# Patient Record
Sex: Female | Born: 1990 | Race: Black or African American | Hispanic: No | Marital: Married | Smoking: Never smoker
Health system: Southern US, Community
[De-identification: ages and names within clinical notes are randomized; demographics above are authoritative.]

## PROBLEM LIST (undated history)

## (undated) ENCOUNTER — Inpatient Hospital Stay (HOSPITAL_COMMUNITY): Payer: Self-pay

## (undated) DIAGNOSIS — B999 Unspecified infectious disease: Secondary | ICD-10-CM

## (undated) DIAGNOSIS — G43909 Migraine, unspecified, not intractable, without status migrainosus: Secondary | ICD-10-CM

---

## 2014-04-14 ENCOUNTER — Ambulatory Visit (INDEPENDENT_AMBULATORY_CARE_PROVIDER_SITE_OTHER): Payer: BLUE CROSS/BLUE SHIELD | Admitting: Family Medicine

## 2014-04-14 VITALS — BP 120/72 | HR 76 | Temp 97.5°F | Resp 16 | Ht 63.0 in | Wt 170.8 lb

## 2014-04-14 DIAGNOSIS — H6120 Impacted cerumen, unspecified ear: Secondary | ICD-10-CM

## 2014-04-14 NOTE — Patient Instructions (Signed)
Good to see you today- take care  Let me know if your left shoulder is not continuing to improve Do some "wall crawls" with your fingers to maintain your shoulder range of motion  I am sorry that you had this fall- please let me know if you don't continue getting your strength back Assuming all is well please see me in about 4-6 months  

## 2014-04-14 NOTE — Progress Notes (Signed)
[facility] [address] [place] [address] [phone]  Date:  04/14/2014   Name:  Connie Medina   DOB:  1990-03-21   MRN:  [id number]  PCP:  No primary care provider on file.    Chief Complaint: Cerumen Impaction   History of Present Illness:  Connie Medina is a 24 y.o. very pleasant female patient who presents with the following:  She notes a problem with her right ear- she tried to clean it out yesterday with a home cerumen impaction kit but his seems to have make it worse.  It does not hurt but it is hard for her to hear.   OW she feels fell today  There are no active problems to display for this patient.   History reviewed. No pertinent past medical history.  History reviewed. No pertinent past surgical history.  History  Substance Use Topics  . Smoking status: Never Smoker   . Smokeless tobacco: Not on file  . Alcohol Use: Not on file    No family history on file.  No Known Allergies  Medication list has been reviewed and updated.  No current outpatient prescriptions on file prior to visit.   No current facility-administered medications on file prior to visit.    Review of Systems:  As per HPI- otherwise negative.   Physical Examination: Filed Vitals:   04/14/14 1307  BP: 120/72  Pulse: 76  Temp: 97.5 F (36.4 C)  Resp: 16   Filed Vitals:   04/14/14 1307  Height: 5' 3"  (1.6 m)  Weight: 170 lb 12.8 oz (77.474 kg)   Body mass index is 30.26 kg/(m^2). Ideal Body Weight: Weight in (lb) to have BMI = 25: 140.8  GEN: WDWN, NAD, Non-toxic, A & O x 3, looks well, overweight HEENT: Atraumatic, Normocephalic. Neck supple. No masses, No LAD.  PEERL,  Bilateral ears with cerumen impaction Ears and Nose: No external deformity. CV: RRR, No M/G/R. No JVD. No thrill. No extra heart sounds. PULM: CTA B, no wheezes, crackles, rhonchi. No retractions. No resp. distress. No accessory muscle use. EXTR: No c/c/e NEURO Normal gait.   PSYCH: Normally interactive. Conversant. Not depressed or anxious appearing.  Calm demeanor.   Bilateral cerumen impaction, irrigated and easily cleared with warm water.  Ear canals clear, bilateral TM wnl, sx resolved   Assessment and Plan: Cerumen impaction, unspecified laterality  Resolved as above.  No other concerns today   Signed Lamar Blinks, MD

## 2014-07-21 ENCOUNTER — Ambulatory Visit (INDEPENDENT_AMBULATORY_CARE_PROVIDER_SITE_OTHER): Payer: BLUE CROSS/BLUE SHIELD | Admitting: Family Medicine

## 2014-07-21 VITALS — BP 110/80 | HR 66 | Temp 98.3°F | Resp 18 | Ht 62.5 in | Wt 168.8 lb

## 2014-07-21 DIAGNOSIS — Z0000 Encounter for general adult medical examination without abnormal findings: Secondary | ICD-10-CM | POA: Diagnosis not present

## 2014-07-21 DIAGNOSIS — Z0184 Encounter for antibody response examination: Secondary | ICD-10-CM

## 2014-07-21 DIAGNOSIS — Z111 Encounter for screening for respiratory tuberculosis: Secondary | ICD-10-CM | POA: Diagnosis not present

## 2014-07-21 DIAGNOSIS — Z789 Other specified health status: Secondary | ICD-10-CM

## 2014-07-21 NOTE — Progress Notes (Signed)
Subjective:  This chart was scribed for Connie Cheadle, MD by Moises Blood, Medical Scribe. This patient was seen in Room 3 and the patient's care was started 5:29 PM.    Patient ID: Connie Medina, female    DOB: 1990/04/19, 24 y.o.   MRN: [id number] Chief Complaint  Patient presents with  . Annual Exam    for school physical    HPI Connie Medina is a 24 y.o. female who presents to [facility] for annual exam for school.  Her last Tdap was last done in 2008. She has her MMR done. She needs surface antibody quant titer done.  She denies being on any medication. She also denies any injuries recently.  No Fhx of cancer.   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   History reviewed. No pertinent past medical history. History reviewed. No pertinent past surgical history. No current outpatient prescriptions on file prior to visit.   No current facility-administered medications on file prior to visit.   No Known Allergies History reviewed. No pertinent family history. History   Social History  . Marital Status: Single    Spouse Name: N/A  . Number of Children: N/A  . Years of Education: N/A   Social History Main Topics  . Smoking status: Never Smoker   . Smokeless tobacco: Not on file  . Alcohol Use: Not on file  . Drug Use: Not on file  . Sexual Activity: Not on file   Other Topics Concern  . None   Social History Narrative     Review of Systems  Constitutional: Negative for fever, chills, appetite change and fatigue.  HENT: Negative for congestion, rhinorrhea and sore throat.   Respiratory: Negative for cough and shortness of breath.   Gastrointestinal: Negative for nausea, vomiting and diarrhea.  Skin: Negative for rash and wound.  All other systems reviewed and are negative.      Objective:   Physical Exam  Constitutional: She is oriented to person, place, and time. She appears well-developed and well-nourished. No distress.  HENT:  Head: Normocephalic and  atraumatic.  Right Ear: External ear normal.  Left Ear: External ear normal.  Nose: Nose normal.  Mouth/Throat: No oropharyngeal exudate.  Eyes: EOM are normal. Pupils are equal, round, and reactive to light.  Neck: Neck supple. No thyromegaly present.  Cardiovascular: Normal rate, regular rhythm, S1 normal, S2 normal and normal heart sounds.   No murmur heard. Pulses:      Dorsalis pedis pulses are 2+ on the right side, and 2+ on the left side.  Pulmonary/Chest: Effort normal and breath sounds normal. No respiratory distress. She has no wheezes.  Abdominal: Soft. Bowel sounds are normal.  Musculoskeletal: Normal range of motion. She exhibits no edema.  Lymphadenopathy:    She has no cervical adenopathy.  Neurological: She is alert and oriented to person, place, and time.  Reflex Scores:      Patellar reflexes are 2+ on the right side and 2+ on the left side.      Achilles reflexes are 2+ on the right side and 2+ on the left side. Skin: Skin is warm and dry.  Psychiatric: She has a normal mood and affect. Her behavior is normal.  Nursing note and vitals reviewed.   BP 110/80 mmHg  Pulse 66  Temp(Src) 98.3 F (36.8 C) (Oral)  Resp 18  Ht 5' 2.5" (1.588 m)  Wt 168 lb 12.8 oz (76.567 kg)  BMI 30.36 kg/m2  SpO2 99%  LMP  07/12/2014       Assessment & Plan:  HPV series before 24 years of age, recommended RTC for pap smear even if not sexually active recommended Tetanus, due Feb 2018, recommended screening labs lipid, cmp, etc once every 5 years, pt declines for today, but will maybe in future convenience.  1. Health maintenance examination - all prior immunizations were present on [facility] and copy given to pt - she had tdap 03/2006 so needs td booster next yr - pt declines today. Has had varicella x2 and menactra x1.  Has not had HPV - pt recommended to start series prior to 24 yo and prior to beginning sexual activity.  Has never had pap, periods nml, no prior intercourse and no  plans to change that in the near future  2. 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   Form for school completed today.  Orders Placed This Encounter  Procedures  . Hepatitis B surface antibody  . TB Skin Test    Order Specific Question:  Has patient ever tested positive?    Answer:  No    No orders of the defined types were placed in this encounter.    I personally performed the services described in this documentation, which was scribed in my presence. The recorded information has been reviewed and considered, and addended by me as needed.  Connie Cheadle, MD MPH

## 2014-07-21 NOTE — Patient Instructions (Addendum)
Recommend HPV series.  Needs Td by 03/2016  Health Maintenance Adopting a healthy lifestyle and getting preventive care can go a long way to promote health and wellness. Talk with your health care provider about what schedule of regular examinations is right for you. This is a good chance for you to check in with your provider about disease prevention and staying healthy. In between checkups, there are plenty of things you can do on your own. Experts have done a lot of research about which lifestyle changes and preventive measures are most likely to keep you healthy. Ask your health care provider for more information. WEIGHT AND DIET  Eat a healthy diet  Be sure to include plenty of vegetables, fruits, low-fat dairy products, and lean protein.  Do not eat a lot of foods high in solid fats, added sugars, or salt.  Get regular exercise. This is one of the most important things you can do for your health.  Most adults should exercise for at least 150 minutes each week. The exercise should increase your heart rate and make you sweat (moderate-intensity exercise).  Most adults should also do strengthening exercises at least twice a week. This is in addition to the moderate-intensity exercise.  Maintain a healthy weight  Body mass index (BMI) is a measurement that can be used to identify possible weight problems. It estimates body fat based on height and weight. Your health care provider can help determine your BMI and help you achieve or maintain a healthy weight.  For females 39 years of age and older:   A BMI below 18.5 is considered underweight.  A BMI of 18.5 to 24.9 is normal.  A BMI of 25 to 29.9 is considered overweight.  A BMI of 30 and above is considered obese.  Watch levels of cholesterol and blood lipids  You should start having your blood tested for lipids and cholesterol at 24 years of age, then have this test every 5 years.  You may need to have your cholesterol levels  checked more often if:  Your lipid or cholesterol levels are high.  You are older than 24 years of age.  You are at high risk for heart disease.  CANCER SCREENING   Lung Cancer  Lung cancer screening is recommended for adults 1-43 years old who are at high risk for lung cancer because of a history of smoking.  A yearly low-dose CT scan of the lungs is recommended for people who:  Currently smoke.  Have quit within the past 15 years.  Have at least a 30-pack-year history of smoking. A pack year is smoking an average of one pack of cigarettes a day for 1 year.  Yearly screening should continue until it has been 15 years since you quit.  Yearly screening should stop if you develop a health problem that would prevent you from having lung cancer treatment.  Breast Cancer  Practice breast self-awareness. This means understanding how your breasts normally appear and feel.  It also means doing regular breast self-exams. Let your health care provider know about any changes, no matter how small.  If you are in your 20s or 30s, you should have a clinical breast exam (CBE) by a health care provider every 1-3 years as part of a regular health exam.  If you are 25 or older, have a CBE every year. Also consider having a breast X-ray (mammogram) every year.  If you have a family history of breast cancer, talk to your health  care provider about genetic screening.  If you are at high risk for breast cancer, talk to your health care provider about having an MRI and a mammogram every year.  Breast cancer gene (BRCA) assessment is recommended for women who have family members with BRCA-related cancers. BRCA-related cancers include:  Breast.  Ovarian.  Tubal.  Peritoneal cancers.  Results of the assessment will determine the need for genetic counseling and BRCA1 and BRCA2 testing. Cervical Cancer Routine pelvic examinations to screen for cervical cancer are no longer recommended for  nonpregnant women who are considered low risk for cancer of the pelvic organs (ovaries, uterus, and vagina) and who do not have symptoms. A pelvic examination may be necessary if you have symptoms including those associated with pelvic infections. Ask your health care provider if a screening pelvic exam is right for you.   The Pap test is the screening test for cervical cancer for women who are considered at risk.  If you had a hysterectomy for a problem that was not cancer or a condition that could lead to cancer, then you no longer need Pap tests.  If you are older than 65 years, and you have had normal Pap tests for the past 10 years, you no longer need to have Pap tests.  If you have had past treatment for cervical cancer or a condition that could lead to cancer, you need Pap tests and screening for cancer for at least 20 years after your treatment.  If you no longer get a Pap test, assess your risk factors if they change (such as having a new sexual partner). This can affect whether you should start being screened again.  Some women have medical problems that increase their chance of getting cervical cancer. If this is the case for you, your health care provider may recommend more frequent screening and Pap tests.  The human papillomavirus (HPV) test is another test that may be used for cervical cancer screening. The HPV test looks for the virus that can cause cell changes in the cervix. The cells collected during the Pap test can be tested for HPV.  The HPV test can be used to screen women 43 years of age and older. Getting tested for HPV can extend the interval between normal Pap tests from three to five years.  An HPV test also should be used to screen women of any age who have unclear Pap test results.  After 24 years of age, women should have HPV testing as often as Pap tests.  Colorectal Cancer  This type of cancer can be detected and often prevented.  Routine colorectal cancer  screening usually begins at 24 years of age and continues through 24 years of age.  Your health care provider may recommend screening at an earlier age if you have risk factors for colon cancer.  Your health care provider may also recommend using home test kits to check for hidden blood in the stool.  A small camera at the end of a tube can be used to examine your colon directly (sigmoidoscopy or colonoscopy). This is done to check for the earliest forms of colorectal cancer.  Routine screening usually begins at age 31.  Direct examination of the colon should be repeated every 5-10 years through 24 years of age. However, you may need to be screened more often if early forms of precancerous polyps or small growths are found. Skin Cancer  Check your skin from head to toe regularly.  Tell your health  care provider about any new moles or changes in moles, especially if there is a change in a mole's shape or color.  Also tell your health care provider if you have a mole that is larger than the size of a pencil eraser.  Always use sunscreen. Apply sunscreen liberally and repeatedly throughout the day.  Protect yourself by wearing long sleeves, pants, a wide-brimmed hat, and sunglasses whenever you are outside. HEART DISEASE, DIABETES, AND HIGH BLOOD PRESSURE   Have your blood pressure checked at least every 1-2 years. High blood pressure causes heart disease and increases the risk of stroke.  If you are between 55 years and 87 years old, ask your health care provider if you should take aspirin to prevent strokes.  Have regular diabetes screenings. This involves taking a blood sample to check your fasting blood sugar level.  If you are at a normal weight and have a low risk for diabetes, have this test once every three years after 24 years of age.  If you are overweight and have a high risk for diabetes, consider being tested at a younger age or more often. PREVENTING INFECTION  Hepatitis  B  If you have a higher risk for hepatitis B, you should be screened for this virus. You are considered at high risk for hepatitis B if:  You were born in a country where hepatitis B is common. Ask your health care provider which countries are considered high risk.  Your parents were born in a high-risk country, and you have not been immunized against hepatitis B (hepatitis B vaccine).  You have HIV or AIDS.  You use needles to inject street drugs.  You live with someone who has hepatitis B.  You have had sex with someone who has hepatitis B.  You get hemodialysis treatment.  You take certain medicines for conditions, including cancer, organ transplantation, and autoimmune conditions. Hepatitis C  Blood testing is recommended for:  Everyone born from [birth year] through 1965.  Anyone with known risk factors for hepatitis C. Sexually transmitted infections (STIs)  You should be screened for sexually transmitted infections (STIs) including gonorrhea and chlamydia if:  You are sexually active and are younger than 24 years of age.  You are older than 24 years of age and your health care provider tells you that you are at risk for this type of infection.  Your sexual activity has changed since you were last screened and you are at an increased risk for chlamydia or gonorrhea. Ask your health care provider if you are at risk.  If you do not have HIV, but are at risk, it may be recommended that you take a prescription medicine daily to prevent HIV infection. This is called pre-exposure prophylaxis (PrEP). You are considered at risk if:  You are sexually active and do not regularly use condoms or know the HIV status of your partner(s).  You take drugs by injection.  You are sexually active with a partner who has HIV. Talk with your health care provider about whether you are at high risk of being infected with HIV. If you choose to begin PrEP, you should first be tested for HIV. You should  then be tested every 3 months for as long as you are taking PrEP.  PREGNANCY   If you are premenopausal and you may become pregnant, ask your health care provider about preconception counseling.  If you may become pregnant, take 400 to 800 micrograms (mcg) of folic acid every day.  If you want to prevent pregnancy, talk to your health care provider about birth control (contraception). OSTEOPOROSIS AND MENOPAUSE   Osteoporosis is a disease in which the bones lose minerals and strength with aging. This can result in serious bone fractures. Your risk for osteoporosis can be identified using a bone density scan.  If you are 92 years of age or older, or if you are at risk for osteoporosis and fractures, ask your health care provider if you should be screened.  Ask your health care provider whether you should take a calcium or vitamin D supplement to lower your risk for osteoporosis.  Menopause may have certain physical symptoms and risks.  Hormone replacement therapy may reduce some of these symptoms and risks. Talk to your health care provider about whether hormone replacement therapy is right for you.  HOME CARE INSTRUCTIONS   Schedule regular health, dental, and eye exams.  Stay current with your immunizations.   Do not use any tobacco products including cigarettes, chewing tobacco, or electronic cigarettes.  If you are pregnant, do not drink alcohol.  If you are breastfeeding, limit how much and how often you drink alcohol.  Limit alcohol intake to no more than 1 drink per day for nonpregnant women. One drink equals 12 ounces of beer, 5 ounces of wine, or 1 ounces of hard liquor.  Do not use street drugs.  Do not share needles.  Ask your health care provider for help if you need support or information about quitting drugs.  Tell your health care provider if you often feel depressed.  Tell your health care provider if you have ever been abused or do not feel safe at  home. Document Released: 08/02/2010 Document Revised: 06/03/2013 Document Reviewed: 12/19/2012 [facility] Patient Information 2015 [place], [place]. This information is not intended to replace advice given to you by your health care provider. Make sure you discuss any questions you have with your health care provider.

## 2014-07-22 LAB — HEPATITIS B SURFACE ANTIBODY, QUANTITATIVE: Hepatitis B-Post: 506 m[IU]/mL

## 2014-07-23 ENCOUNTER — Encounter: Payer: Self-pay | Admitting: Family Medicine

## 2014-07-24 ENCOUNTER — Ambulatory Visit (INDEPENDENT_AMBULATORY_CARE_PROVIDER_SITE_OTHER): Payer: BLUE CROSS/BLUE SHIELD

## 2014-07-24 DIAGNOSIS — Z111 Encounter for screening for respiratory tuberculosis: Secondary | ICD-10-CM

## 2014-07-24 LAB — TB SKIN TEST: TB Skin Test: NEGATIVE

## 2014-07-24 NOTE — Progress Notes (Deleted)
   Subjective:    Patient ID: Connie Medina, female    DOB: [birth year], 24 y.o.   MRN: [id number]  HPI    Review of Systems     Objective:   Physical Exam        Assessment & Plan:

## 2014-07-24 NOTE — Progress Notes (Signed)
Patient came in today for PPD read. Was negative with 0 mm induration.

## 2014-08-05 ENCOUNTER — Encounter (HOSPITAL_COMMUNITY): Payer: Self-pay | Admitting: Emergency Medicine

## 2014-08-05 ENCOUNTER — Emergency Department (HOSPITAL_COMMUNITY)
Admission: EM | Admit: 2014-08-05 | Discharge: 2014-08-05 | Disposition: A | Discharge status: Home / Self Care | Payer: BLUE CROSS/BLUE SHIELD | Source: Home / Self Care | Attending: Emergency Medicine | Admitting: Emergency Medicine

## 2014-08-05 DIAGNOSIS — A059 Bacterial foodborne intoxication, unspecified: Secondary | ICD-10-CM | POA: Diagnosis not present

## 2014-08-05 LAB — POCT URINALYSIS DIP (DEVICE)
Bilirubin Urine: NEGATIVE
Glucose, UA: NEGATIVE mg/dL
Hgb urine dipstick: NEGATIVE
Ketones, ur: NEGATIVE mg/dL
LEUKOCYTES UA: NEGATIVE
NITRITE: NEGATIVE
PROTEIN: NEGATIVE mg/dL
Specific Gravity, Urine: >=1.03 (ref 1.005–1.030)
Urobilinogen, UA: 0.2 mg/dL (ref 0.0–1.0)
pH: 5.5 (ref 5.0–8.0)

## 2014-08-05 LAB — POCT PREGNANCY, URINE: Preg Test, Ur: NEGATIVE

## 2014-08-05 MED ORDER — ONDANSETRON HCL 4 MG PO TABS: 4.0000 mg | ORAL_TABLET | Freq: Three times a day (TID) | ORAL | Status: DC | PRN

## 2014-08-05 NOTE — ED Provider Notes (Signed)
CSN: [id number]     Arrival date & time 08/05/14  1426 History   First MD Initiated Contact with Patient 08/05/14 1614     Chief Complaint  Patient presents with  . Abdominal Pain   (Consider location/radiation/quality/duration/timing/severity/associated sxs/prior Treatment) HPI  She is a 24 year old woman here for evaluation of abdominal pain and diarrhea. Her symptoms started this morning with watery diarrhea. Over the course of the day she developed intermittent abdominal pains as well as some nausea. She denies any vomiting or fevers. She is currently fasting for Ramadan so has not tried eating or drinking anything. No vomiting. She denies any dizziness or palpitations.  She ate at a friend's house last night. She denies any food that had been out a long time and does not remember any undercooked meat or eggs.  History reviewed. No pertinent past medical history. History reviewed. No pertinent past surgical history. No family history on file. History  Substance Use Topics  . Smoking status: Never Smoker   . Smokeless tobacco: Not on file  . Alcohol Use: Not on file   OB History    No data available     Review of Systems As in history of present illness Allergies  Review of patient's allergies indicates no known allergies.  Home Medications   Prior to Admission medications   Medication Sig Start Date End Date Taking? Authorizing Provider  ondansetron (ZOFRAN) 4 MG tablet Take 1 tablet (4 mg total) by mouth every 8 (eight) hours as needed for nausea or vomiting. 08/05/14   Charm RingsErin J Breyona Swander, MD   BP 111/82 mmHg  Pulse 71  Temp(Src) 99.2 F (37.3 C) (Oral)  Resp 16  SpO2 100%  LMP 07/12/2014 Physical Exam  Constitutional: She is oriented to person, place, and time. She appears well-developed and well-nourished. No distress.  Cardiovascular: Normal rate, regular rhythm and normal heart sounds.   Pulmonary/Chest: Effort normal and breath sounds normal. No respiratory distress. She  has no wheezes. She has no rales.  Abdominal: Soft. She exhibits no distension and no mass. There is no tenderness. There is no rebound and no guarding.  Increased bowel sounds  Neurological: She is alert and oriented to person, place, and time.  Skin: Skin is warm and dry.    ED Course  Procedures (including critical care time) Labs Review Labs Reviewed  POCT URINALYSIS DIP (DEVICE)  POCT PREGNANCY, URINE    Imaging Review No results found.   MDM   1. Food poisoning    Zofran for nausea. Discussed importance of hydration. Return precautions reviewed. Work note provided.    Charm RingsErin J Kohle Winner, MD 08/05/14 [phone]

## 2014-08-05 NOTE — ED Notes (Signed)
C/o abd pain onset this am when she woke up Sx also include: fatigue, diarrhea, nauseas Denies urinary sx, fevers, chills Alert, no signs of acute distress.

## 2014-08-05 NOTE — Discharge Instructions (Signed)
This is likely food poisoning. Take Zofran every 8 hours as needed for nausea. Make sure you are taking plenty of fluids when able. The diarrhea should resolve in the next 1-2 days. If you are getting dizzy or your heart rate is racing, please come back.

## 2014-09-12 ENCOUNTER — Other Ambulatory Visit: Payer: Self-pay | Admitting: Otolaryngology

## 2014-09-25 ENCOUNTER — Encounter (HOSPITAL_BASED_OUTPATIENT_CLINIC_OR_DEPARTMENT_OTHER): Payer: Self-pay | Admitting: *Deleted

## 2014-09-30 ENCOUNTER — Ambulatory Visit (HOSPITAL_BASED_OUTPATIENT_CLINIC_OR_DEPARTMENT_OTHER): Payer: BLUE CROSS/BLUE SHIELD | Admitting: Certified Registered"

## 2014-09-30 ENCOUNTER — Encounter (HOSPITAL_BASED_OUTPATIENT_CLINIC_OR_DEPARTMENT_OTHER)
Admission: RE | Disposition: A | Discharge status: Home / Self Care | Payer: Self-pay | Source: Ambulatory Visit | Attending: Otolaryngology

## 2014-09-30 ENCOUNTER — Ambulatory Visit (HOSPITAL_BASED_OUTPATIENT_CLINIC_OR_DEPARTMENT_OTHER)
Admission: RE | Admit: 2014-09-30 | Discharge: 2014-09-30 | Disposition: A | Discharge status: Home / Self Care | Payer: BLUE CROSS/BLUE SHIELD | Source: Ambulatory Visit | Attending: Otolaryngology | Admitting: Otolaryngology

## 2014-09-30 ENCOUNTER — Encounter (HOSPITAL_BASED_OUTPATIENT_CLINIC_OR_DEPARTMENT_OTHER): Payer: Self-pay

## 2014-09-30 DIAGNOSIS — J3501 Chronic tonsillitis: Secondary | ICD-10-CM | POA: Diagnosis not present

## 2014-09-30 DIAGNOSIS — J353 Hypertrophy of tonsils with hypertrophy of adenoids: Secondary | ICD-10-CM | POA: Diagnosis not present

## 2014-09-30 HISTORY — PX: TONSILLECTOMY AND ADENOIDECTOMY: SHX28

## 2014-09-30 LAB — POCT HEMOGLOBIN-HEMACUE: Hemoglobin: 13.3 g/dL (ref 12.0–15.0)

## 2014-09-30 SURGERY — TONSILLECTOMY AND ADENOIDECTOMY
Anesthesia: General | Laterality: Bilateral

## 2014-09-30 MED ORDER — PROPOFOL 10 MG/ML IV BOLUS
INTRAVENOUS | Status: DC | PRN
  Administered 2014-09-30: 250 mg via INTRAVENOUS

## 2014-09-30 MED ORDER — OXYCODONE HCL 5 MG/5ML PO SOLN: 5.0000 mg | Freq: Once | ORAL | Status: DC | PRN

## 2014-09-30 MED ORDER — HYDROMORPHONE HCL 1 MG/ML IJ SOLN
INTRAMUSCULAR | Status: AC
  Filled 2014-09-30: qty 1

## 2014-09-30 MED ORDER — BACITRACIN 500 UNIT/GM EX OINT
TOPICAL_OINTMENT | CUTANEOUS | Status: DC | PRN
  Administered 2014-09-30: 1 via TOPICAL

## 2014-09-30 MED ORDER — SODIUM CHLORIDE 0.9 % IR SOLN
Status: DC | PRN
  Administered 2014-09-30: 1

## 2014-09-30 MED ORDER — BACITRACIN ZINC 500 UNIT/GM EX OINT
TOPICAL_OINTMENT | CUTANEOUS | Status: AC
  Filled 2014-09-30: qty 1.8

## 2014-09-30 MED ORDER — SCOPOLAMINE 1 MG/3DAYS TD PT72: 1.0000 | MEDICATED_PATCH | Freq: Once | TRANSDERMAL | Status: DC | PRN

## 2014-09-30 MED ORDER — DEXAMETHASONE SODIUM PHOSPHATE 4 MG/ML IJ SOLN
INTRAMUSCULAR | Status: DC | PRN
  Administered 2014-09-30: 10 mg via INTRAVENOUS

## 2014-09-30 MED ORDER — BACITRACIN ZINC 500 UNIT/GM EX OINT
TOPICAL_OINTMENT | CUTANEOUS | Status: AC
  Filled 2014-09-30: qty 28.35

## 2014-09-30 MED ORDER — LACTATED RINGERS IV SOLN
INTRAVENOUS | Status: DC
  Administered 2014-09-30 (×2): via INTRAVENOUS

## 2014-09-30 MED ORDER — MIDAZOLAM HCL 2 MG/2ML IJ SOLN
INTRAMUSCULAR | Status: AC
  Filled 2014-09-30: qty 2

## 2014-09-30 MED ORDER — LIDOCAINE HCL (CARDIAC) 20 MG/ML IV SOLN
INTRAVENOUS | Status: DC | PRN
  Administered 2014-09-30: 80 mg via INTRAVENOUS

## 2014-09-30 MED ORDER — MEPERIDINE HCL 25 MG/ML IJ SOLN: 6.2500 mg | INTRAMUSCULAR | Status: DC | PRN

## 2014-09-30 MED ORDER — MUPIROCIN 2 % EX OINT
TOPICAL_OINTMENT | CUTANEOUS | Status: AC
  Filled 2014-09-30: qty 22

## 2014-09-30 MED ORDER — HYDROMORPHONE HCL 1 MG/ML IJ SOLN
0.2500 mg | INTRAMUSCULAR | Status: DC | PRN
  Administered 2014-09-30 (×4): 0.5 mg via INTRAVENOUS

## 2014-09-30 MED ORDER — PROPOFOL 500 MG/50ML IV EMUL
INTRAVENOUS | Status: AC
  Filled 2014-09-30: qty 50

## 2014-09-30 MED ORDER — MIDAZOLAM HCL 5 MG/5ML IJ SOLN
INTRAMUSCULAR | Status: DC | PRN
  Administered 2014-09-30: 2 mg via INTRAVENOUS

## 2014-09-30 MED ORDER — SUCCINYLCHOLINE CHLORIDE 20 MG/ML IJ SOLN
INTRAMUSCULAR | Status: DC | PRN
  Administered 2014-09-30: 100 mg via INTRAVENOUS

## 2014-09-30 MED ORDER — OXYMETAZOLINE HCL 0.05 % NA SOLN
NASAL | Status: AC
  Filled 2014-09-30: qty 30

## 2014-09-30 MED ORDER — ONDANSETRON HCL 4 MG/2ML IJ SOLN
INTRAMUSCULAR | Status: DC | PRN
  Administered 2014-09-30: 4 mg via INTRAVENOUS

## 2014-09-30 MED ORDER — OXYCODONE HCL 5 MG PO TABS: 5.0000 mg | ORAL_TABLET | Freq: Once | ORAL | Status: DC | PRN

## 2014-09-30 MED ORDER — OXYMETAZOLINE HCL 0.05 % NA SOLN
NASAL | Status: DC | PRN
  Administered 2014-09-30: 1 via TOPICAL

## 2014-09-30 MED ORDER — AMOXICILLIN 400 MG/5ML PO SUSR: 800.0000 mg | Freq: Two times a day (BID) | ORAL | Status: AC

## 2014-09-30 MED ORDER — OXYCODONE HCL 5 MG/5ML PO SOLN: 5.0000 mg | ORAL | Status: DC | PRN

## 2014-09-30 MED ORDER — LIDOCAINE-EPINEPHRINE 1 %-1:100000 IJ SOLN
INTRAMUSCULAR | Status: AC
  Filled 2014-09-30: qty 2

## 2014-09-30 MED ORDER — FENTANYL CITRATE (PF) 100 MCG/2ML IJ SOLN
INTRAMUSCULAR | Status: AC
  Filled 2014-09-30: qty 4

## 2014-09-30 MED ORDER — FENTANYL CITRATE (PF) 100 MCG/2ML IJ SOLN
INTRAMUSCULAR | Status: DC | PRN
  Administered 2014-09-30 (×2): 50 ug via INTRAVENOUS
  Administered 2014-09-30: 100 ug via INTRAVENOUS

## 2014-09-30 MED ORDER — GLYCOPYRROLATE 0.2 MG/ML IJ SOLN: 0.2000 mg | Freq: Once | INTRAMUSCULAR | Status: DC | PRN

## 2014-09-30 SURGICAL SUPPLY — 34 items
BANDAGE COBAN STERILE 2 (GAUZE/BANDAGES/DRESSINGS) IMPLANT
CANISTER SUCT 1200ML W/VALVE (MISCELLANEOUS) ×3 IMPLANT
CATH ROBINSON RED A/P 10FR (CATHETERS) IMPLANT
CATH ROBINSON RED A/P 14FR (CATHETERS) ×3 IMPLANT
COAGULATOR SUCT 6 FR SWTCH (ELECTROSURGICAL)
COAGULATOR SUCT SWTCH 10FR 6 (ELECTROSURGICAL) IMPLANT
COVER MAYO STAND STRL (DRAPES) ×3 IMPLANT
ELECT REM PT RETURN 9FT ADLT (ELECTROSURGICAL) ×3
ELECT REM PT RETURN 9FT PED (ELECTROSURGICAL)
ELECTRODE REM PT RETRN 9FT PED (ELECTROSURGICAL) IMPLANT
ELECTRODE REM PT RTRN 9FT ADLT (ELECTROSURGICAL) ×1 IMPLANT
GLOVE BIO SURGEON STRL SZ 6.5 (GLOVE) ×2 IMPLANT
GLOVE BIO SURGEON STRL SZ7.5 (GLOVE) ×3 IMPLANT
GLOVE BIO SURGEONS STRL SZ 6.5 (GLOVE) ×1
GLOVE BIOGEL PI IND STRL 7.0 (GLOVE) ×1 IMPLANT
GLOVE BIOGEL PI INDICATOR 7.0 (GLOVE) ×2
GOWN STRL REUS W/ TWL LRG LVL3 (GOWN DISPOSABLE) ×2 IMPLANT
GOWN STRL REUS W/TWL LRG LVL3 (GOWN DISPOSABLE) ×4
IV NS 500ML (IV SOLUTION) ×2
IV NS 500ML BAXH (IV SOLUTION) ×1 IMPLANT
MARKER SKIN DUAL TIP RULER LAB (MISCELLANEOUS) IMPLANT
NS IRRIG 1000ML POUR BTL (IV SOLUTION) ×3 IMPLANT
SHEET MEDIUM DRAPE 40X70 STRL (DRAPES) ×3 IMPLANT
SOLUTION BUTLER CLEAR DIP (MISCELLANEOUS) ×3 IMPLANT
SPONGE GAUZE 4X4 12PLY STER LF (GAUZE/BANDAGES/DRESSINGS) ×3 IMPLANT
SPONGE TONSIL 1 RF SGL (DISPOSABLE) IMPLANT
SPONGE TONSIL 1.25 RF SGL STRG (GAUZE/BANDAGES/DRESSINGS) ×3 IMPLANT
SYR BULB 3OZ (MISCELLANEOUS) ×3 IMPLANT
TOWEL OR 17X24 6PK STRL BLUE (TOWEL DISPOSABLE) ×3 IMPLANT
TUBE CONNECTING 20'X1/4 (TUBING) ×1
TUBE CONNECTING 20X1/4 (TUBING) ×2 IMPLANT
TUBE SALEM SUMP 12R W/ARV (TUBING) IMPLANT
TUBE SALEM SUMP 16 FR W/ARV (TUBING) ×3 IMPLANT
WAND COBLATOR 70 EVAC XTRA (SURGICAL WAND) ×3 IMPLANT

## 2014-09-30 NOTE — Discharge Instructions (Addendum)
°Post Anesthesia Home Care Instructions ° °Activity: °Get plenty of rest for the remainder of the day. A responsible adult should stay with you for 24 hours following the procedure.  °For the next 24 hours, DO NOT: °-Drive a car °-Operate machinery °-Drink alcoholic beverages °-Take any medication unless instructed by your physician °-Make any legal decisions or sign important papers. ° °Meals: °Start with liquid foods such as gelatin or soup. Progress to regular foods as tolerated. Avoid greasy, spicy, heavy foods. If nausea and/or vomiting occur, drink only clear liquids until the nausea and/or vomiting subsides. Call your physician if vomiting continues. ° °Special Instructions/Symptoms: °Your throat may feel dry or sore from the anesthesia or the breathing tube placed in your throat during surgery. If this causes discomfort, gargle with warm salt water. The discomfort should disappear within 24 hours. ° °If you had a scopolamine patch placed behind your ear for the management of post- operative nausea and/or vomiting: ° °1. The medication in the patch is effective for 72 hours, after which it should be removed.  Wrap patch in a tissue and discard in the trash. Wash hands thoroughly with soap and water. °2. You may remove the patch earlier than 72 hours if you experience unpleasant side effects which may include dry mouth, dizziness or visual disturbances. °3. Avoid touching the patch. Wash your hands with soap and water after contact with the patch. °  °[facility] TE[facility] M[facility] °Postoperative Instructions for Tonsillectomy & Adenoidectomy (T&A) °Activity °Restrict activity at home for the first two days, resting as much as possible. Light indoor activity is best. You may usually return to school or work within a week but void strenuous activity and sports for two weeks. Sleep with your head elevated on 2-3 pillows for 3-4 days to help decrease swelling. °Diet °Due to tissue swelling and throat discomfort, you  may have little desire to drink for several days. However fluids are very important to prevent dehydration. You will find that non-acidic juices, soups, popsicles, Jell-O, custard, puddings, and any soft or mashed foods taken in small quantities can be swallowed fairly easily. Try to increase your fluid and food intake as the discomfort subsides. It is recommended that a child receive 1-1/2 quarts of fluid in a 24-hour period. Adult require twice this amount.  °Discomfort °Your sore throat may be relieved by applying an ice collar to your neck and/or by taking Tylenol®. You may experience an earache, which is due to referred pain from the throat. Referred ear pain is commonly felt at night when trying to rest. ° °Bleeding                        Although rare, there is risk of having some bleeding during the first 2 weeks after having a T&A. This usually happens between days 7-10 postoperatively. If you or your child should have any bleeding, try to remain calm. We recommend sitting up quietly in a chair and gently spitting out the blood into a bowl. For adults, gargling gently with ice water may help. If the bleeding does not stop after a short time (5 minutes), is more than 1 teaspoonful, or if you become worried, please call our office at [phone] or go directly to the nearest hospital emergency room. Do not eat or drink anything prior to going to the hospital as you may need to be taken to the operating room in order to control the bleeding. °GENERAL CONSIDERATIONS °  1. Brush your teeth regularly. Avoid mouthwashes and gargles for three weeks. You may gargle gently with warm salt-water as necessary or spray with Chloraseptic®. You may make salt-water by placing 2 teaspoons of table salt into a quart of fresh water. Warm the salt-water in a microwave to a luke warm temperature.  °2. Avoid exposure to colds and upper respiratory infections if possible.  °3. If you look into a mirror or into your child's mouth,  you will see white-gray patches in the back of the throat. This is normal after having a T&A and is like a scab that forms on the skin after an abrasion. It will disappear once the back of the throat heals completely. However, it may cause a noticeable odor; this too will disappear with time. Again, warm salt-water gargles may be used to help keep the throat clean and promote healing.  °4. You may notice a temporary change in voice quality, such as a higher pitched voice or a nasal sound, until healing is complete. This may last for 1-2 weeks and should resolve.  °5. Do not take or give you child any medications that we have not prescribed or recommended.  °6. Snoring may occur, especially at night, for the first week after a T&A. It is due to swelling of the soft palate and will usually resolve.  °Please call our office at [phone] if you have any questions.   °

## 2014-09-30 NOTE — Anesthesia Postprocedure Evaluation (Signed)
  Anesthesia Post-op Note  Patient: [name]  Procedure(s) Performed: Procedure(s): BILATERAL TONSILLECTOMY AND ADENOIDECTOMY (Bilateral)  Patient Location: PACU  Anesthesia Type: General   Level of Consciousness: awake, alert  and oriented  Airway and Oxygen Therapy: Patient Spontanous Breathing  Post-op Pain: mild  Post-op Assessment: Post-op Vital signs reviewed  Post-op Vital Signs: Reviewed  Last Vitals:  Filed Vitals:   09/30/14 0915  BP: 121/78  Pulse: 63  Temp: 36.9 C  Resp: 20    Complications: No apparent anesthesia complications

## 2014-09-30 NOTE — Anesthesia Preprocedure Evaluation (Signed)

## 2014-09-30 NOTE — H&P (Signed)
Cc: Adenotonsillar hypertrophy, chronic tonsillitis  HPI: The patient is a 24 y/o female who presents today with complaints of frequent tonsilloliths and halitosis. The patient has noted the problems for most of her life but they have gradually becoming more bothersome. She notes frequent sore throats. She has no significant history of snoring. The patient is otherwise healthy. Previous ENT surgery is denied.  The patient's review of systems (constitutional, eyes, ENT, cardiovascular, respiratory, GI, musculoskeletal, skin, neurologic, psychiatric, endocrine, hematologic, allergic) is noted in the ROS questionnaire.  It is reviewed with the patient.   Allergies: None  Family health history: Diabetes.   Major events: None.   Ongoing medical problems: Migraines.   Social history: The patient is single. She denies the use of alcohol, tobacco or illegal drugs.  Exam General: Communicates without difficulty, well nourished, no acute distress. Head: Normocephalic, no evidence injury, no tenderness, facial buttresses intact without stepoff. Eyes: PERRL, EOMI. No scleral icterus, conjunctivae clear. Neuro: CN II exam reveals vision grossly intact.  No nystagmus at any point of gaze. Ears: Auricles well formed without lesions.  Ear canals are intact without mass or lesion.  No erythema or edema is appreciated.  The TMs are intact without fluid. Nose: External evaluation reveals normal support and skin without lesions.  Dorsum is intact.  Anterior rhinoscopy reveals healthy pink mucosa over anterior aspect of inferior turbinates and intact septum.  No purulence noted. Oral:  Oral cavity and oropharynx are intact, symmetric, without erythema or edema.  Mucosa is moist without lesions. Tonsils 2+,cryptic. Neck: Full range of motion without pain.  There is no significant lymphadenopathy.  No masses palpable.  Thyroid bed within normal limits to palpation.  Parotid glands and submandibular glands equal  bilaterally without mass.  Trachea is midline. Neuro:  CN 2-12 grossly intact. Gait normal. Vestibular: No nystagmus at any point of gaze. The cerebellar examination is unremarkable.   Assessment Chronic tonsillitis with history of frequent tonsilloliths with associated halitosis. Tonsils are 2+ and cryptic.   Plan  1. The treatment options include continuing conservative observation versus adenotonsillectomy.  Based on the patient's history and physical exam findings, the patient will likely benefit from having the tonsils and possibly the adenoid removed.  The risks, benefits, alternatives, and details of the procedure are reviewed with the patient.  Questions are invited and answered.  2. The patient is interested in proceeding with the procedure.  We will schedule the procedure in accordance with the patient's schedule.

## 2014-09-30 NOTE — Transfer of Care (Signed)
Immediate Anesthesia Transfer of Care Note  Patient: Connie Medina  Procedure(s) Performed: Procedure(s): BILATERAL TONSILLECTOMY AND ADENOIDECTOMY (Bilateral)  Patient Location: PACU  Anesthesia Type:General  Level of Consciousness: awake, alert  and patient cooperative  Airway & Oxygen Therapy: Patient Spontanous Breathing and Patient connected to face mask oxygen  Post-op Assessment: Report given to RN, Post -op Vital signs reviewed and stable and Patient moving all extremities  Post vital signs: Reviewed and stable  Last Vitals:  Filed Vitals:   09/30/14 0633  BP: 118/80  Pulse: 67  Temp: 36.9 C  Resp: 18    Complications: No apparent anesthesia complications

## 2014-09-30 NOTE — Op Note (Signed)
DATE OF PROCEDURE:  09/30/2014                              OPERATIVE REPORT  SURGEON:  Newman Pies, MD  PREOPERATIVE DIAGNOSES: 1. Adenotonsillar hypertrophy. 2. Chronic tonsillitis and pharyngitis  POSTOPERATIVE DIAGNOSES: 1. Adenotonsillar hypertrophy. 2. Chronic tonsillitis and pharyngitis  PROCEDURE PERFORMED:  Adenotonsillectomy.  ANESTHESIA:  General endotracheal tube anesthesia.  COMPLICATIONS:  None.  ESTIMATED BLOOD LOSS:  Minimal.  INDICATION FOR PROCEDURE:  Connie Medina is a 24 y.o. female with a history of chronic tonsillitis/pharyngitis and halitosis.  According to the patient, she has been experiencing chronic throat discomfort with halitosis for several years. The patient continues to be symptomatic despite medical treatments. On examination, the patient was noted to have bilateral cryptic tonsils, with numerous tonsilloliths. Based on the above findings, the decision was made for the patient to undergo the adenotonsillectomy procedure. Likelihood of success in reducing symptoms was also discussed.  The risks, benefits, alternatives, and details of the procedure were discussed with the mother.  Questions were invited and answered.  Informed consent was obtained.  DESCRIPTION:  The patient was taken to the operating room and placed supine on the operating table.  General endotracheal tube anesthesia was administered by the anesthesiologist.  The patient was positioned and prepped and draped in a standard fashion for adenotonsillectomy.  A Crowe-Davis mouth gag was inserted into the oral cavity for exposure. 2+ cryptic tonsils were noted bilaterally.  No bifidity was noted.  Indirect mirror examination of the nasopharynx revealed mild adenoid hypertrophy. The adenoid was resected with the Coblator device. Hemostasis was achieved with the Coblator device.  The right tonsil was then grasped with a straight Allis clamp and retracted medially.  It was resected free from the underlying  pharyngeal constrictor muscles with the Coblator device.  The same procedure was repeated on the left side without exception.  The surgical sites were copiously irrigated.  The mouth gag was removed.  The care of the patient was turned over to the anesthesiologist.  The patient was awakened from anesthesia without difficulty.  The patient was extubated and transferred to the recovery room in good condition.  OPERATIVE FINDINGS:  Adenotonsillar hypertrophy.  SPECIMEN:  Bilateral tonsils  FOLLOWUP CARE:  The patient will be discharged home once awake and alert.  She will be placed on amoxicillin 800 mg p.o. b.i.d. for 5 days, and Roxicet 5-40ml po q 4 hours for postop pain control.   The patient will follow up in my office in approximately 2 weeks.  Sherrice Creekmore,SUI W 09/30/2014 8:16 AM

## 2014-09-30 NOTE — Anesthesia Procedure Notes (Signed)
Procedure Name: Intubation Date/Time: 09/30/2014 7:42 AM Performed by: Curly [place] Pre-anesthesia Checklist: Patient identified, Emergency Drugs available, Suction available and Patient being monitored Patient Re-evaluated:Patient Re-evaluated prior to inductionOxygen Delivery Method: Circle System Utilized Preoxygenation: Pre-oxygenation with 100% oxygen Intubation Type: IV induction Ventilation: Mask ventilation without difficulty Laryngoscope Size: Miller and 2 Grade View: Grade I Tube type: Oral Tube size: 7.0 mm Number of attempts: 1 Airway Equipment and Method: Stylet Placement Confirmation: ETT inserted through vocal cords under direct vision,  positive ETCO2 and breath sounds checked- equal and bilateral Secured at: 21 cm Tube secured with: Tape Dental Injury: Teeth and Oropharynx as per pre-operative assessment

## 2014-10-01 ENCOUNTER — Encounter (HOSPITAL_BASED_OUTPATIENT_CLINIC_OR_DEPARTMENT_OTHER): Payer: Self-pay | Admitting: Otolaryngology

## 2015-03-23 ENCOUNTER — Ambulatory Visit (INDEPENDENT_AMBULATORY_CARE_PROVIDER_SITE_OTHER): Payer: BLUE CROSS/BLUE SHIELD | Admitting: Physician Assistant

## 2015-03-23 VITALS — BP 116/84 | HR 68 | Temp 98.1°F | Resp 16 | Ht 63.25 in | Wt 172.0 lb

## 2015-03-23 DIAGNOSIS — G43009 Migraine without aura, not intractable, without status migrainosus: Secondary | ICD-10-CM

## 2015-03-23 MED ORDER — BUTALBITAL-APAP-CAFFEINE 50-325-40 MG PO TABS: 1.0000 | ORAL_TABLET | Freq: Four times a day (QID) | ORAL | Status: AC | PRN

## 2015-03-23 NOTE — Progress Notes (Signed)
03/23/2015 3:22 PM   DOB: Jan 12, 1991 / MRN: [id number]  SUBJECTIVE:  Connie Medina is a 25 y.o. female presenting for recurring HA that is often severe and only occurs behind her right eye.  States that she has these roughly once every two months, states that she has photophobia and is typically debilitated by these HA's.  Denies any weakness and focal neurological problems associated with these HA. HA's can last up to two days.   Had her eyes checked and this did not help.  Has not seen a neurologist.  HA's have been occuring for roughly 5 years. She would like to try Fiorcet, of which her friend gave her one for a similar HA and this worked well for her.    She has No Known Allergies.   She  has no past medical history on file.    She  reports that she has never smoked. She does not have any smokeless tobacco history on file. She  has no sexual activity history on file. The patient  has past surgical history that includes Tonsillectomy and adenoidectomy (Bilateral, 09/30/2014).  Her family history is not on file.  Review of Systems  Constitutional: Negative for fever and chills.  Eyes: Negative for blurred vision.  Respiratory: Negative for cough and shortness of breath.   Cardiovascular: Negative for chest pain.  Gastrointestinal: Negative for nausea and abdominal pain.  Genitourinary: Negative for dysuria, urgency and frequency.  Musculoskeletal: Negative for myalgias.  Skin: Negative for rash.  Neurological: Negative for dizziness, tingling and headaches.  Psychiatric/Behavioral: Negative for depression. The patient is not nervous/anxious.     Problem list and medications reviewed and updated by myself where necessary, and exist elsewhere in the encounter.   OBJECTIVE:  BP 116/84 mmHg  Pulse 68  Temp(Src) 98.1 F (36.7 C)  Resp 16  Ht 5' 3.25" (1.607 m)  Wt 172 lb (78.019 kg)  BMI 30.21 kg/m2  SpO2 96%  Physical Exam  Constitutional: She is oriented to person, place,  and time. She appears well-nourished. No distress.  Eyes: EOM are normal. Pupils are equal, round, and reactive to light.  Cardiovascular: Normal rate.   Pulmonary/Chest: Effort normal.  Abdominal: She exhibits no distension.  Neurological: She is alert and oriented to person, place, and time. She has normal strength and normal reflexes. She displays no atrophy and no tremor. No cranial nerve deficit or sensory deficit. She exhibits normal muscle tone. She displays a negative Romberg sign. She displays no seizure activity. Coordination and gait normal. GCS eye subscore is 4. GCS verbal subscore is 5. GCS motor subscore is 6.  ROM, FTN, position sense, temp sensation, and soft touch to the extremities intact to challenge.    Skin: Skin is dry. She is not diaphoretic.  Psychiatric: She has a normal mood and affect.  Vitals reviewed.   No results found for this or any previous visit (from the past 72 hour(s)).  No results found.  ASSESSMENT AND PLAN  Connie Medina was seen today for migraine.  Diagnoses and all orders for this visit:  Migraine without aura and without status migrainosus, not intractable: The her HA's are consistent with migraine aside from the duration of her HA.  Will send her Neuro for a more through evaluation in the coming weeks.  Fiorcet for now.   -     Ambulatory referral to Neurology -     butalbital-acetaminophen-caffeine (FIORICET) 50-325-40 MG tablet; Take 1-2 tablets by mouth every 6 (six) hours as  needed for headache.    The patient was advised to call or return to clinic if she does not see an improvement in symptoms or to seek the care of the closest emergency department if she worsens with the above plan.   Deliah Boston, MHS, PA-C [facility] 03/23/2015 3:22 PM

## 2015-09-01 ENCOUNTER — Encounter (HOSPITAL_COMMUNITY): Payer: Self-pay | Admitting: Emergency Medicine

## 2015-09-01 ENCOUNTER — Ambulatory Visit (HOSPITAL_COMMUNITY)
Admission: EM | Admit: 2015-09-01 | Discharge: 2015-09-01 | Disposition: A | Discharge status: Home / Self Care | Payer: BLUE CROSS/BLUE SHIELD | Attending: Family Medicine | Admitting: Family Medicine

## 2015-09-01 DIAGNOSIS — R21 Rash and other nonspecific skin eruption: Secondary | ICD-10-CM | POA: Diagnosis not present

## 2015-09-01 HISTORY — DX: Migraine, unspecified, not intractable, without status migrainosus: G43.909

## 2015-09-01 MED ORDER — METHYLPREDNISOLONE ACETATE 80 MG/ML IJ SUSP
80.0000 mg | Freq: Once | INTRAMUSCULAR | Status: AC
  Administered 2015-09-01: 80 mg via INTRAMUSCULAR

## 2015-09-01 MED ORDER — METHYLPREDNISOLONE ACETATE 80 MG/ML IJ SUSP
INTRAMUSCULAR | Status: AC
  Filled 2015-09-01: qty 1

## 2015-09-01 MED ORDER — HYDROXYZINE HCL 25 MG PO TABS: 25.0000 mg | ORAL_TABLET | Freq: Four times a day (QID) | ORAL | 0 refills | Status: DC

## 2015-09-01 MED ORDER — METHYLPREDNISOLONE 4 MG PO TBPK: ORAL_TABLET | ORAL | 0 refills | Status: DC

## 2015-09-01 NOTE — ED Provider Notes (Signed)
CSN: [id number]     Arrival date & time 09/01/15  1517 History   None    Chief Complaint  Patient presents with  . Rash   (Consider location/radiation/quality/duration/timing/severity/associated sxs/prior Treatment) Patient got tattoos on bilateral wrists and hands and now she has rash and blisters and itching.   The history is provided by the patient.  Rash  Location:  Hand Hand rash location:  R hand, L hand, L wrist and R wrist Quality: blistering and itchiness   Onset quality:  Sudden Duration:  1 day Timing:  Constant Progression:  Spreading Chronicity:  New Context: chemical exposure   Relieved by:  Antihistamines and antibiotic cream Worsened by:  Nothing Ineffective treatments:  Antibiotic cream and antihistamines   Past Medical History:  Diagnosis Date  . Migraines    Past Surgical History:  Procedure Laterality Date  . TONSILLECTOMY AND ADENOIDECTOMY Bilateral 09/30/2014   Procedure: BILATERAL TONSILLECTOMY AND ADENOIDECTOMY;  Surgeon: Newman Pies, MD;  Location: [facility];  Service: ENT;  Laterality: Bilateral;   History reviewed. No pertinent family history. Social History  Substance Use Topics  . Smoking status: Never Smoker  . Smokeless tobacco: Never Used  . Alcohol use No   OB History    No data available     Review of Systems  Constitutional: Negative.   HENT: Negative.   Eyes: Negative.   Respiratory: Negative.   Cardiovascular: Negative.   Gastrointestinal: Negative.   Endocrine: Negative.   Genitourinary: Negative.   Skin: Positive for rash.  Allergic/Immunologic: Negative.   Neurological: Negative.   Hematological: Negative.   Psychiatric/Behavioral: Negative.     Allergies  Review of patient's allergies indicates no known allergies.  Home Medications   Prior to Admission medications   Medication Sig Start Date End Date Taking? Authorizing Provider  butalbital-acetaminophen-caffeine (FIORICET) [phone] MG tablet  Take 1-2 tablets by mouth every 6 (six) hours as needed for headache. 03/23/15 03/22/16 Yes Ofilia Neas, PA-C  hydrOXYzine (ATARAX/VISTARIL) 25 MG tablet Take 1 tablet (25 mg total) by mouth every 6 (six) hours. 09/01/15   Deatra Canter, FNP  methylPREDNISolone (MEDROL DOSEPAK) 4 MG TBPK tablet Take 6-5-4-3-2-1 po qd 09/01/15   Deatra Canter, FNP   Meds Ordered and Administered this Visit   Medications  methylPREDNISolone acetate (DEPO-MEDROL) injection 80 mg (80 mg Intramuscular Given 09/01/15 1715)    BP 115/67 (BP Location: Left Arm)   Pulse 86   Temp 98.7 F (37.1 C) (Oral)   Resp 16   LMP 08/15/2015 (Exact Date)   SpO2 100%  No data found.   Physical Exam  Constitutional: She appears well-developed and well-nourished.  HENT:  Head: Normocephalic and atraumatic.  Eyes: Conjunctivae and EOM are normal. Pupils are equal, round, and reactive to light.  Neck: Normal range of motion. Neck supple.  Cardiovascular: Normal rate, regular rhythm and normal heart sounds.   Pulmonary/Chest: Effort normal and breath sounds normal.  Skin: Rash noted.  Bilateral hands and wrists with henna tattoos and blisters and itchy rash on tattoos.  Nursing note and vitals reviewed.   Urgent Care Course   Clinical Course    Procedures (including critical care time)  Labs Review Labs Reviewed - No data to display  Imaging Review No results found.   Visual Acuity Review  Right Eye Distance:   Left Eye Distance:   Bilateral Distance:    Right Eye Near:   Left Eye Near:    Bilateral Near:  MDM   1. Rash    Depomedrol  IM Medrol dose pack as directe Hydroxyzine 25 mg one po tid prn #12      Deatra Canter, FNP 09/01/15 1729

## 2015-09-01 NOTE — ED Triage Notes (Signed)
Pt is having a reaction to the Henna tattoos she got on Saturday.  She has burning and blisters on her tattoos on her hands and forearms, but not her feet.

## 2016-08-07 ENCOUNTER — Encounter (HOSPITAL_COMMUNITY): Payer: Self-pay | Admitting: Emergency Medicine

## 2016-08-07 DIAGNOSIS — X58XXXA Exposure to other specified factors, initial encounter: Secondary | ICD-10-CM | POA: Insufficient documentation

## 2016-08-07 DIAGNOSIS — R21 Rash and other nonspecific skin eruption: Secondary | ICD-10-CM | POA: Diagnosis not present

## 2016-08-07 DIAGNOSIS — Y998 Other external cause status: Secondary | ICD-10-CM | POA: Diagnosis not present

## 2016-08-07 DIAGNOSIS — L2489 Irritant contact dermatitis due to other agents: Secondary | ICD-10-CM | POA: Diagnosis not present

## 2016-08-07 DIAGNOSIS — Y929 Unspecified place or not applicable: Secondary | ICD-10-CM | POA: Diagnosis not present

## 2016-08-07 DIAGNOSIS — S40822A Blister (nonthermal) of left upper arm, initial encounter: Secondary | ICD-10-CM | POA: Diagnosis present

## 2016-08-07 DIAGNOSIS — Y9389 Activity, other specified: Secondary | ICD-10-CM | POA: Diagnosis not present

## 2016-08-07 DIAGNOSIS — Z79899 Other long term (current) drug therapy: Secondary | ICD-10-CM | POA: Insufficient documentation

## 2016-08-07 NOTE — ED Triage Notes (Signed)
Pt reports getting henna tattoo applied to bilateral arms and hands on Thursday, started out as burning sensation and now blisters have formed all over hands and arms.

## 2016-08-07 NOTE — ED Notes (Signed)
Pt at nurses station requesting update, apologized for delay.

## 2016-08-08 ENCOUNTER — Emergency Department (HOSPITAL_COMMUNITY)
Admission: EM | Admit: 2016-08-08 | Discharge: 2016-08-08 | Disposition: A | Discharge status: Home / Self Care | Payer: BLUE CROSS/BLUE SHIELD | Attending: Emergency Medicine | Admitting: Emergency Medicine

## 2016-08-08 DIAGNOSIS — L309 Dermatitis, unspecified: Secondary | ICD-10-CM

## 2016-08-08 DIAGNOSIS — R21 Rash and other nonspecific skin eruption: Secondary | ICD-10-CM

## 2016-08-08 MED ORDER — METHYLPREDNISOLONE SODIUM SUCC 125 MG IJ SOLR
125.0000 mg | Freq: Once | INTRAMUSCULAR | Status: AC
  Administered 2016-08-08: 125 mg via INTRAMUSCULAR
  Filled 2016-08-08: qty 2

## 2016-08-08 MED ORDER — PREDNISONE 10 MG (21) PO TBPK: ORAL_TABLET | Freq: Every day | ORAL | 0 refills | Status: DC

## 2016-08-08 MED ORDER — DIPHENHYDRAMINE HCL 25 MG PO TABS: 25.0000 mg | ORAL_TABLET | Freq: Four times a day (QID) | ORAL | 0 refills | Status: DC

## 2016-08-08 MED ORDER — IBUPROFEN 800 MG PO TABS
800.0000 mg | ORAL_TABLET | Freq: Once | ORAL | Status: AC
  Administered 2016-08-08: 800 mg via ORAL
  Filled 2016-08-08: qty 1

## 2016-08-08 MED ORDER — IBUPROFEN 800 MG PO TABS: 800.0000 mg | ORAL_TABLET | Freq: Three times a day (TID) | ORAL | 0 refills | Status: DC

## 2016-08-08 MED ORDER — DIPHENHYDRAMINE HCL 25 MG PO CAPS
50.0000 mg | ORAL_CAPSULE | Freq: Once | ORAL | Status: AC
  Administered 2016-08-08: 50 mg via ORAL
  Filled 2016-08-08: qty 2

## 2016-08-08 NOTE — ED Provider Notes (Signed)
[facility]-EMERGENCY DEPT Provider Note   CSN: [id number] Arrival date & time: 08/07/16  2200     History   Chief Complaint Chief Complaint  Patient presents with  . Blisters    HPI Connie Medina is a 26 y.o. female who is previously healthy who presents with blisters on her arms and legs after getting a henna tattoo 4 days ago. Patient reports she initially had some burning and itching to the areas of the tattoo the first and second day, however then she developed blisters. She has had some of the blisters burst. She continues to have pain to the area. Patient has been applying steroid cream and taking antihistamine without significant relief. She also took one dose of Tylenol for pain. Patient reports she had small blisters develop one year ago when she got this similar tattoo, however not nearly as large blisters area patient was seen at urgent care for that at the time and states she got an injection in her glute and was sent home with steroids. She denies any fevers, chest pain, shortness of breath, swelling of her lips, tongue, throat, abdominal pain, nausea, vomiting, urinary symptoms.  HPI  Past Medical History:  Diagnosis Date  . Migraines     There are no active problems to display for this patient.   Past Surgical History:  Procedure Laterality Date  . TONSILLECTOMY AND ADENOIDECTOMY Bilateral 09/30/2014   Procedure: BILATERAL TONSILLECTOMY AND ADENOIDECTOMY;  Surgeon: Newman Pies, MD;  Location: [facility];  Service: ENT;  Laterality: Bilateral;    OB History    No data available       Home Medications    Prior to Admission medications   Medication Sig Start Date End Date Taking? Authorizing Provider  diphenhydrAMINE (BENADRYL) 25 MG tablet Take 1 tablet (25 mg total) by mouth every 6 (six) hours. 08/08/16   Emi Holes, PA-C  hydrOXYzine (ATARAX/VISTARIL) 25 MG tablet Take 1 tablet (25 mg total) by mouth every 6 (six) hours. 09/01/15   Deatra Canter,  FNP  ibuprofen (ADVIL,MOTRIN) 800 MG tablet Take 1 tablet (800 mg total) by mouth 3 (three) times daily. 08/08/16   Emi Holes, PA-C  methylPREDNISolone (MEDROL DOSEPAK) 4 MG TBPK tablet Take 6-5-4-3-2-1 po qd 09/01/15   Deatra Canter, FNP  predniSONE (STERAPRED UNI-PAK 21 TAB) 10 MG (21) TBPK tablet Take by mouth daily. Take 6 tabs by mouth daily  for 2 days, then 5 tabs for 2 days, then 4 tabs for 2 days, then 3 tabs for 2 days, 2 tabs for 2 days, then 1 tab by mouth daily for 2 days 08/08/16   Emi Holes, PA-C    Family History No family history on file.  Social History Social History  Substance Use Topics  . Smoking status: Never Smoker  . Smokeless tobacco: Never Used  . Alcohol use No     Allergies   Patient has no known allergies.   Review of Systems Review of Systems  Constitutional: Negative for chills and fever.  HENT: Negative for facial swelling and sore throat.   Respiratory: Negative for shortness of breath.   Cardiovascular: Negative for chest pain.  Gastrointestinal: Negative for abdominal pain, nausea and vomiting.  Genitourinary: Negative for dysuria.  Musculoskeletal: Negative for back pain.  Skin: Positive for rash. Negative for wound.  Neurological: Negative for headaches.  Psychiatric/Behavioral: The patient is not nervous/anxious.      Physical Exam Updated Vital Signs BP 109/72 (BP Location:  Right Arm)   Pulse 65   Temp (!) 97.5 F (36.4 C) (Oral)   Resp 18   Ht 5\' 2"  (1.575 m)   Wt 77.1 kg (170 lb)   LMP 07/08/2016 (Approximate)   SpO2 98%   BMI 31.09 kg/m   Physical Exam  Constitutional: She appears well-developed and well-nourished. No distress.  HENT:  Head: Normocephalic and atraumatic.  Mouth/Throat: Oropharynx is clear and moist. No oropharyngeal exudate.  Eyes: Conjunctivae are normal. Pupils are equal, round, and reactive to light. Right eye exhibits no discharge. Left eye exhibits no discharge. No scleral icterus.    Neck: Normal range of motion. Neck supple. No thyromegaly present.  Cardiovascular: Normal rate, regular rhythm, normal heart sounds and intact distal pulses.  Exam reveals no gallop and no friction rub.   No murmur heard. Pulmonary/Chest: Effort normal and breath sounds normal. No stridor. No respiratory distress. She has no wheezes. She has no rales.  Abdominal: Soft. Bowel sounds are normal. She exhibits no distension. There is no tenderness. There is no rebound and no guarding.  Musculoskeletal: She exhibits no edema.  Lymphadenopathy:    She has no cervical adenopathy.  Neurological: She is alert. Coordination normal.  Skin: Skin is warm and dry. No rash noted. She is not diaphoretic. No pallor.  Fluid-filled blisters over areas of henna tattoo on bilateral arms and legs; blisters worse on bilateral arms; unaffected areas nontender; no surrounding erythema  Psychiatric: She has a normal mood and affect.  Nursing note and vitals reviewed.            ED Treatments / Results  Labs (all labs ordered are listed, but only abnormal results are displayed) Labs Reviewed - No data to display  EKG  EKG Interpretation None       Radiology No results found.  Procedures Procedures (including critical care time)  Medications Ordered in ED Medications  methylPREDNISolone sodium succinate (SOLU-MEDROL) 125 mg/2 mL injection 125 mg (125 mg Intramuscular Given 08/08/16 0138)  diphenhydrAMINE (BENADRYL) capsule 50 mg (50 mg Oral Given 08/08/16 0138)  ibuprofen (ADVIL,MOTRIN) tablet 800 mg (800 mg Oral Given 08/08/16 0208)     Initial Impression / Assessment and Plan / ED Course  I have reviewed the triage vital signs and the nursing notes.  Pertinent labs & imaging results that were available during my care of the patient were reviewed by me and considered in my medical decision making (see chart for details).     I spoke with Patty at [facility] Control who recommended supportive  treatment and watching for infection. There is no need for labs or further workup per poison control.   Patient with most likely localized dermatitis reaction to henna tattoo. Patient given IM Solu-Medrol and oral Benadryl in the ED. We'll initiate prednisone taper and Benadryl at discharge.ibuprofen and Tylenol for pain control. Patient advised to watch for signs of infection and return if any present. Patient advised to never have henna tattoo again, as her reaction to be much worse or life-threatening. She understands and agrees she will not. No signs of infection at this time. Patient vitals stable throughout ED course and discharged in satisfactory condition. I discussed patient case with Dr. Elesa Massed who guided the patient's management and agrees with plan.   Final Clinical Impressions(s) / ED Diagnoses   Final diagnoses:  Rash  Dermatitis    New Prescriptions New Prescriptions   DIPHENHYDRAMINE (BENADRYL) 25 MG TABLET    Take 1 tablet (25 mg total)  by mouth every 6 (six) hours.   IBUPROFEN (ADVIL,MOTRIN) 800 MG TABLET    Take 1 tablet (800 mg total) by mouth 3 (three) times daily.   PREDNISONE (STERAPRED UNI-PAK 21 TAB) 10 MG (21) TBPK TABLET    Take by mouth daily. Take 6 tabs by mouth daily  for 2 days, then 5 tabs for 2 days, then 4 tabs for 2 days, then 3 tabs for 2 days, 2 tabs for 2 days, then 1 tab by mouth daily for 2 days     Emi HolesLaw, Shavaun Osterloh M, PA-C 08/08/16 0220    Ward, Layla MawKristen N, DO 08/08/16 0221

## 2016-08-08 NOTE — Discharge Instructions (Signed)
Medications: Prednisone, Benadryl  Treatment: Take prednisone as prescribed for 12 days. Take Benadryl every 6 hours. You can apply hydrocortisone cream to help with itching. Do not use henna ever again, as you could have a much worse, life-threatening, reaction next time.  Follow-up: Please keep watch for infection. If you develop any fever, increasing pain, redness, swelling, yellow drainage, or red streaking from any of the areas, please return to the emergency department or see a doctor immediately. Also return to the emergency department if you develop any other new or worsening symptoms such as difficulty breathing, swelling of her lips, tongue, or throat.

## 2016-10-26 LAB — OB RESULTS CONSOLE ANTIBODY SCREEN: Antibody Screen: NEGATIVE

## 2016-10-26 LAB — OB RESULTS CONSOLE GC/CHLAMYDIA
Chlamydia: NEGATIVE
Gonorrhea: NEGATIVE

## 2016-10-26 LAB — OB RESULTS CONSOLE RUBELLA ANTIBODY, IGM: Rubella: IMMUNE

## 2016-10-26 LAB — OB RESULTS CONSOLE HIV ANTIBODY (ROUTINE TESTING): HIV: NONREACTIVE

## 2016-10-26 LAB — OB RESULTS CONSOLE HEPATITIS B SURFACE ANTIGEN: HEP B S AG: NEGATIVE

## 2016-10-26 LAB — OB RESULTS CONSOLE ABO/RH: RH TYPE: POSITIVE

## 2016-10-26 LAB — OB RESULTS CONSOLE RPR: RPR: NONREACTIVE

## 2016-11-22 ENCOUNTER — Other Ambulatory Visit: Payer: Self-pay | Admitting: Obstetrics and Gynecology

## 2016-11-22 ENCOUNTER — Other Ambulatory Visit (HOSPITAL_COMMUNITY)
Admission: RE | Admit: 2016-11-22 | Discharge: 2016-11-22 | Disposition: A | Discharge status: Home / Self Care | Payer: Medicaid Other | Source: Ambulatory Visit | Attending: Obstetrics and Gynecology | Admitting: Obstetrics and Gynecology

## 2016-11-22 DIAGNOSIS — Z124 Encounter for screening for malignant neoplasm of cervix: Secondary | ICD-10-CM | POA: Diagnosis present

## 2016-11-24 LAB — CYTOLOGY - PAP
Adequacy: ABSENT
CHLAMYDIA, DNA PROBE: NEGATIVE
DIAGNOSIS: NEGATIVE
NEISSERIA GONORRHEA: NEGATIVE

## 2017-01-31 NOTE — L&D Delivery Note (Signed)
Operative Delivery Note At 1:07 AM a viable female was delivered via Vaginal, Vacuum Investment banker, operational).  Presentation: vertex; Position: Occiput,, Anterior; Station: +3.  Verbal consent: obtained from patient.  Risks and benefits discussed in detail.  Risks include, but are not limited to the risks of anesthesia, bleeding, infection, damage to maternal tissues, fetal cephalhematoma.  There is also the risk of inability to effect vaginal delivery of the head, or shoulder dystocia that cannot be resolved by established maneuvers, leading to the need for emergency cesarean section.  1 pull cycle with vacuum, no pop off. Pressure maintained in the green zone during the contraction.  Delivery effected within 1 minute of vacuum application.   APGAR: 6, 8; weight 7 lb 4.8 oz (3311 g).   Placenta status: , .   Cord:  with the following complications:One true knot .  Cord pH arterial could not be run due to inadequate specimen. Shoulder dystocia resolved with Mc Robert's maneuver and Suprapubic pressure.  Delivery of body effected within 1 minute of shoulder dystocia.   Anesthesia:   Instruments: Kiwi Vacuum.  Applied for indication of fetal decelerations with pushing.   Episiotomy: None Lacerations: Right Labial tear Suture Repair: 3.0 vicryl Est. Blood Loss (mL): 300  Mom to postpartum.  Baby to Couplet care / Skin to Skin.  Konrad Felix, MD.  06/16/2017, 4:13 AM

## 2017-03-31 ENCOUNTER — Inpatient Hospital Stay (HOSPITAL_COMMUNITY)
Admission: AD | Admit: 2017-03-31 | Discharge: 2017-03-31 | Disposition: A | Discharge status: Home / Self Care | Payer: Medicaid Other | Source: Ambulatory Visit | Attending: Obstetrics and Gynecology | Admitting: Obstetrics and Gynecology

## 2017-03-31 ENCOUNTER — Encounter (HOSPITAL_COMMUNITY): Payer: Self-pay | Admitting: *Deleted

## 2017-03-31 ENCOUNTER — Inpatient Hospital Stay (HOSPITAL_COMMUNITY): Payer: Medicaid Other

## 2017-03-31 DIAGNOSIS — O368130 Decreased fetal movements, third trimester, not applicable or unspecified: Secondary | ICD-10-CM | POA: Diagnosis present

## 2017-03-31 DIAGNOSIS — O368190 Decreased fetal movements, unspecified trimester, not applicable or unspecified: Secondary | ICD-10-CM | POA: Diagnosis present

## 2017-03-31 DIAGNOSIS — Z3A30 30 weeks gestation of pregnancy: Secondary | ICD-10-CM | POA: Insufficient documentation

## 2017-03-31 DIAGNOSIS — O98813 Other maternal infectious and parasitic diseases complicating pregnancy, third trimester: Secondary | ICD-10-CM | POA: Diagnosis not present

## 2017-03-31 DIAGNOSIS — B3731 Acute candidiasis of vulva and vagina: Secondary | ICD-10-CM

## 2017-03-31 DIAGNOSIS — B373 Candidiasis of vulva and vagina: Secondary | ICD-10-CM | POA: Insufficient documentation

## 2017-03-31 DIAGNOSIS — O288 Other abnormal findings on antenatal screening of mother: Secondary | ICD-10-CM

## 2017-03-31 LAB — URINALYSIS, ROUTINE W REFLEX MICROSCOPIC
Bilirubin Urine: NEGATIVE
Glucose, UA: NEGATIVE mg/dL
Hgb urine dipstick: NEGATIVE
KETONES UR: NEGATIVE mg/dL
Nitrite: NEGATIVE
PROTEIN: NEGATIVE mg/dL
RBC / HPF: NONE SEEN RBC/hpf (ref 0–5)
Specific Gravity, Urine: 1.004 — ABNORMAL LOW (ref 1.005–1.030)
pH: 8 (ref 5.0–8.0)

## 2017-03-31 LAB — WET PREP, GENITAL
CLUE CELLS WET PREP: NONE SEEN
Sperm: NONE SEEN
TRICH WET PREP: NONE SEEN

## 2017-03-31 MED ORDER — TERCONAZOLE 0.4 % VA CREA: 1.0000 | TOPICAL_CREAM | Freq: Every day | VAGINAL | 0 refills | Status: AC

## 2017-03-31 NOTE — MAU Provider Note (Signed)
History     CSN: [id number]  Arrival date and time: 03/31/17 1040   First Provider Initiated Contact with Patient 03/31/17 1127      Chief Complaint  Patient presents with  . Decreased Fetal Movement  . Vaginal Itching   HPI  Ms.  Connie Medina is a 27 y.o. year old 421P0000 female at 5674w1d weeks gestation who presents to MAU reporting DFM since last evening and vaginal itching for several weeks. She states that "the baby usually doesn't move that much during the day or I'm not aware of it because of me working. However, last night I only felt faint moves and they were not the same as they usually are. The same for this morning." She also reports that her husband became "very itchy with insertion during their last SI." She became concerned after he complained of the itchiness; even though she had felt it for a few weeks prior to now. She states that she thought it was "just normal for pregnancy."  Past Medical History:  Diagnosis Date  . Migraines     Past Surgical History:  Procedure Laterality Date  . TONSILLECTOMY AND ADENOIDECTOMY Bilateral 09/30/2014   Procedure: BILATERAL TONSILLECTOMY AND ADENOIDECTOMY;  Surgeon: Newman PiesSu Teoh, MD;  Location: [facility];  Service: ENT;  Laterality: Bilateral;    History reviewed. No pertinent family history.  Social History   Tobacco Use  . Smoking status: Never Smoker  . Smokeless tobacco: Never Used  Substance Use Topics  . Alcohol use: No    Alcohol/week: 0.0 oz  . Drug use: No    Allergies: No Known Allergies  Medications Prior to Admission  Medication Sig Dispense Refill Last Dose  . diphenhydrAMINE (BENADRYL) 25 MG tablet Take 1 tablet (25 mg total) by mouth every 6 (six) hours. 20 tablet 0   . hydrOXYzine (ATARAX/VISTARIL) 25 MG tablet Take 1 tablet (25 mg total) by mouth every 6 (six) hours. 12 tablet 0   . ibuprofen (ADVIL,MOTRIN) 800 MG tablet Take 1 tablet (800 mg total) by mouth 3 (three) times daily. 21  tablet 0   . methylPREDNISolone (MEDROL DOSEPAK) 4 MG TBPK tablet Take 6-5-4-3-2-1 po qd 21 tablet 0   . predniSONE (STERAPRED UNI-PAK 21 TAB) 10 MG (21) TBPK tablet Take by mouth daily. Take 6 tabs by mouth daily  for 2 days, then 5 tabs for 2 days, then 4 tabs for 2 days, then 3 tabs for 2 days, 2 tabs for 2 days, then 1 tab by mouth daily for 2 days 42 tablet 0     Review of Systems  Constitutional: Negative.   HENT: Negative.   Eyes: Negative.   Respiratory: Negative.   Cardiovascular: Negative.   Gastrointestinal: Negative.   Endocrine: Negative.   Genitourinary: Positive for vaginal discharge ("itching for several weeks").       DFM since last night  Musculoskeletal: Negative.   Skin: Negative.   Allergic/Immunologic: Negative.   Neurological: Negative.   Hematological: Negative.   Psychiatric/Behavioral: Negative.    Physical Exam   Blood pressure 119/88, pulse 89, temperature 98.2 F (36.8 C), temperature source Oral, resp. rate 18, weight 190 lb 8 oz (86.4 kg), last menstrual period 09/01/2016, SpO2 100 %.  Physical Exam  Nursing note and vitals reviewed. Constitutional: She is oriented to person, place, and time. She appears well-developed and well-nourished.  HENT:  Head: Normocephalic and atraumatic.  Eyes: Pupils are equal, round, and reactive to light.  Neck: Normal range of  motion.  Cardiovascular: Normal rate, regular rhythm and normal heart sounds.  Respiratory: Effort normal and breath sounds normal.  GI: Soft. Bowel sounds are normal.  Genitourinary:  Genitourinary Comments: Uterus: gravid, S=D, cx: smooth, pink, no lesions, moderate amt of thick, clumpy, adherent, white vaginal d/c -- wet prep, GC/CT done, closed/long/firm, no CMT or friability, no adnexal tenderness  Dilation: Closed Effacement (%): Thick Cervical Position: Posterior Station: Ballotable Presentation: Undeterminable Exam by: Carloyn Jaeger, CNM   Musculoskeletal: Normal range of motion.   Neurological: She is alert and oriented to person, place, and time.  Skin: Skin is warm and dry.  Psychiatric: She has a normal mood and affect. Her behavior is normal. Judgment and thought content normal.    MAU Course  Procedures  MDM CCUA Wet Prep BPP - 6/8 (off for breathing) NST - FHR: 135 bpm / moderate variability / accels present / decels absent / TOCO: none // reactive after U/S  *Consult with Dr. Richardson Dopp @ 1245 - notified of patient's complaints, assessments, lab & U/S results, tx plan d/c home, return to MAU in 24 hrs for repeat BPP and NST & [facility] instructions   Results for orders placed or performed during the hospital encounter of 03/31/17 (from the past 24 hour(s))  Urinalysis, Routine w reflex microscopic     Status: Abnormal   Collection Time: 03/31/17 10:48 AM  Result Value Ref Range   Color, Urine STRAW (A) YELLOW   APPearance CLEAR CLEAR   Specific Gravity, Urine 1.004 (L) 1.005 - 1.030   pH 8.0 5.0 - 8.0   Glucose, UA NEGATIVE NEGATIVE mg/dL   Hgb urine dipstick NEGATIVE NEGATIVE   Bilirubin Urine NEGATIVE NEGATIVE   Ketones, ur NEGATIVE NEGATIVE mg/dL   Protein, ur NEGATIVE NEGATIVE mg/dL   Nitrite NEGATIVE NEGATIVE   Leukocytes, UA MODERATE (A) NEGATIVE   RBC / HPF NONE SEEN 0 - 5 RBC/hpf   WBC, UA 0-5 0 - 5 WBC/hpf   Bacteria, UA RARE (A) NONE SEEN   Squamous Epithelial / LPF 0-5 (A) NONE SEEN  Wet prep, genital     Status: Abnormal   Collection Time: 03/31/17 11:35 AM  Result Value Ref Range   Yeast Wet Prep HPF POC PRESENT (A) NONE SEEN   Trich, Wet Prep NONE SEEN NONE SEEN   Clue Cells Wet Prep HPF POC NONE SEEN NONE SEEN   WBC, Wet Prep HPF POC MANY (A) NONE SEEN   Sperm NONE SEEN      Assessment and Plan  Candida vaginitis - Plan: Discharge patient - Rx for Terazol cream 1 applicator pv hs x 5 days - Information provided on vaginal yeast infection - Keep scheduled appt next week  Decreased fetal movements in third trimester, single or  unspecified fetus  - [facility] instructions reviewed / form given to record movements - Return to MAU tomorrow for repeat BPP, NST (orders not entered in Epic) - Discharge patient - Patient verbalized an understanding of the plan of care and agrees.    Raelyn Mora, MSN, CNM 03/31/2017, 11:27 AM

## 2017-03-31 NOTE — MAU Note (Signed)
Patient presents with decreased fetal movement since last night.  Pt states she felt some "faint kicks" last night and one this morning, but less than normal.  Denies vaginal bleeding or LOF.  Does report some vaginal itching and that her husband c/o some itching as well after recent sexual intercourse.

## 2017-03-31 NOTE — MAU Note (Signed)
Urine in lab 

## 2017-04-01 ENCOUNTER — Inpatient Hospital Stay (HOSPITAL_COMMUNITY): Payer: Medicaid Other

## 2017-04-01 ENCOUNTER — Inpatient Hospital Stay (HOSPITAL_COMMUNITY)
Admission: AD | Admit: 2017-04-01 | Discharge: 2017-04-01 | Disposition: A | Discharge status: Home / Self Care | Payer: Medicaid Other | Source: Ambulatory Visit | Attending: Obstetrics and Gynecology | Admitting: Obstetrics and Gynecology

## 2017-04-01 ENCOUNTER — Encounter (HOSPITAL_COMMUNITY): Payer: Self-pay | Admitting: *Deleted

## 2017-04-01 DIAGNOSIS — Z3A30 30 weeks gestation of pregnancy: Secondary | ICD-10-CM | POA: Diagnosis not present

## 2017-04-01 DIAGNOSIS — O368130 Decreased fetal movements, third trimester, not applicable or unspecified: Secondary | ICD-10-CM | POA: Insufficient documentation

## 2017-04-01 DIAGNOSIS — Z3689 Encounter for other specified antenatal screening: Secondary | ICD-10-CM

## 2017-04-01 NOTE — MAU Note (Deleted)
vtx confirmed on bedside sono by [place] CNM. Going on transfer to L&D rm 162

## 2017-04-01 NOTE — Discharge Instructions (Signed)

## 2017-04-01 NOTE — MAU Provider Note (Signed)
History   [id number]   Chief Complaint  Patient presents with  . Decreased Fetal Movement    HPI Taiylor Arbutus PedMohamed is a 27 y.o. female  G1P0000 here with report of decreased fetal movement. Was seen in MAU yesterday for decreased fetal movement & had reactive NST, BPP 6/8 (off for breathing). Per Dr. Richardson Doppole, patient to return today for repeat NST & BPP. Patient states she's unsure if she's still have decreased movement b/c she doesn't know what she's supposed to feel. States she never feels consistent movement d/t her work schedule. Has felt baby move today & reports feeling movement since being placed on the monitor in MAU. Denies abdominal pain, vaginal bleeding, or LOF.   Patient's last menstrual period was 09/01/2016.  OB History  Gravida Para Term Preterm AB Living  1 0 0 0 0 0  SAB TAB Ectopic Multiple Live Births  0 0 0 0 0    # Outcome Date GA Lbr Len/2nd Weight Sex Delivery Anes PTL Lv  1 Current               Past Medical History:  Diagnosis Date  . Migraines     History reviewed. No pertinent family history.  Social History   Socioeconomic History  . Marital status: Married    Spouse name: None  . Number of children: None  . Years of education: None  . Highest education level: None  Social Needs  . Financial resource strain: None  . Food insecurity - worry: None  . Food insecurity - inability: None  . Transportation needs - medical: None  . Transportation needs - non-medical: None  Occupational History  . None  Tobacco Use  . Smoking status: Never Smoker  . Smokeless tobacco: Never Used  Substance and Sexual Activity  . Alcohol use: No    Alcohol/week: 0.0 oz  . Drug use: No  . Sexual activity: Yes    Birth control/protection: None  Other Topics Concern  . None  Social History Narrative  . None    No Known Allergies  No current facility-administered medications on file prior to encounter.    Current Outpatient Medications on File Prior to  Encounter  Medication Sig Dispense Refill  . diphenhydrAMINE (BENADRYL) 25 MG tablet Take 1 tablet (25 mg total) by mouth every 6 (six) hours. 20 tablet 0  . hydrOXYzine (ATARAX/VISTARIL) 25 MG tablet Take 1 tablet (25 mg total) by mouth every 6 (six) hours. 12 tablet 0  . ibuprofen (ADVIL,MOTRIN) 800 MG tablet Take 1 tablet (800 mg total) by mouth 3 (three) times daily. 21 tablet 0  . methylPREDNISolone (MEDROL DOSEPAK) 4 MG TBPK tablet Take 6-5-4-3-2-1 po qd 21 tablet 0  . predniSONE (STERAPRED UNI-PAK 21 TAB) 10 MG (21) TBPK tablet Take by mouth daily. Take 6 tabs by mouth daily  for 2 days, then 5 tabs for 2 days, then 4 tabs for 2 days, then 3 tabs for 2 days, 2 tabs for 2 days, then 1 tab by mouth daily for 2 days 42 tablet 0  . terconazole (TERAZOL 7) 0.4 % vaginal cream Place 1 applicator vaginally at bedtime for 5 days. 45 g 0     Review of Systems  Constitutional: Negative.   Gastrointestinal: Negative.   Genitourinary: Negative.    Physical Exam   Vitals:   04/01/17 1538  BP: 121/67  Pulse: 83  Resp: 18  Temp: 97.8 F (36.6 C)    Physical Exam  Nursing note and  vitals reviewed. Constitutional: She is oriented to person, place, and time. She appears well-developed and well-nourished. No distress.  HENT:  Head: Normocephalic and atraumatic.  Eyes: Conjunctivae are normal. Right eye exhibits no discharge. Left eye exhibits no discharge. No scleral icterus.  Neck: Normal range of motion.  Respiratory: Effort normal. No respiratory distress.  Neurological: She is alert and oriented to person, place, and time.  Skin: She is not diaphoretic.  Psychiatric: She has a normal mood and affect. Her behavior is normal. Judgment and thought content normal.    MAU Course  Procedures No results found for this or any previous visit (from the past 24 hour(s)). No results found.  MDM NST:  Baseline: 140 bpm, Variability: Good {> 6 bpm), Accelerations: Reactive and Decelerations:  Absent Reactive NST. Fetal movement heard on monitor & noted on fetal tracing. Patient reports feeling movement in MAU BPP 8/8, AFI 11.8  Dr. Richardson Dopp notified of patient's NST & BPP. Ok with plan for discharge home.  Assessment and Plan  A: 1. NST (non-stress test) reactive   2. Decreased fetal movement affecting management of pregnancy in third trimester, not applicable or unspecified fetus   3. [redacted] weeks gestation of pregnancy    P: Discharge home Fetal kick count form Discussed reasons to return to MAU Keep f/u with OB   Judeth Horn, NP 04/01/2017 3:47 PM

## 2017-04-01 NOTE — MAU Note (Signed)
Here for repeat NST for decreased FM and nonreactive NST yesterday. Pt reports baby is moving. Denies any pain or cramping.

## 2017-04-03 LAB — GC/CHLAMYDIA PROBE AMP (~~LOC~~) NOT AT ARMC
CHLAMYDIA, DNA PROBE: NEGATIVE
NEISSERIA GONORRHEA: NEGATIVE

## 2017-05-01 ENCOUNTER — Inpatient Hospital Stay (HOSPITAL_COMMUNITY)
Admission: AD | Admit: 2017-05-01 | Discharge: 2017-05-01 | Disposition: A | Discharge status: Home / Self Care | Payer: Medicaid Other | Source: Ambulatory Visit | Attending: Obstetrics and Gynecology | Admitting: Obstetrics and Gynecology

## 2017-05-01 ENCOUNTER — Encounter (HOSPITAL_COMMUNITY): Payer: Self-pay | Admitting: *Deleted

## 2017-05-01 ENCOUNTER — Other Ambulatory Visit: Payer: Self-pay

## 2017-05-01 ENCOUNTER — Inpatient Hospital Stay (HOSPITAL_COMMUNITY): Payer: Medicaid Other

## 2017-05-01 DIAGNOSIS — O368130 Decreased fetal movements, third trimester, not applicable or unspecified: Secondary | ICD-10-CM | POA: Diagnosis not present

## 2017-05-01 DIAGNOSIS — O26893 Other specified pregnancy related conditions, third trimester: Secondary | ICD-10-CM | POA: Diagnosis not present

## 2017-05-01 DIAGNOSIS — O368190 Decreased fetal movements, unspecified trimester, not applicable or unspecified: Secondary | ICD-10-CM

## 2017-05-01 DIAGNOSIS — R200 Anesthesia of skin: Secondary | ICD-10-CM | POA: Insufficient documentation

## 2017-05-01 DIAGNOSIS — Z3A34 34 weeks gestation of pregnancy: Secondary | ICD-10-CM | POA: Diagnosis not present

## 2017-05-01 DIAGNOSIS — Z3689 Encounter for other specified antenatal screening: Secondary | ICD-10-CM

## 2017-05-01 HISTORY — DX: Unspecified infectious disease: B99.9

## 2017-05-01 LAB — CBC
HCT: 32.4 % — ABNORMAL LOW (ref 36.0–46.0)
Hemoglobin: 10.8 g/dL — ABNORMAL LOW (ref 12.0–15.0)
MCH: 28.7 pg (ref 26.0–34.0)
MCHC: 33.3 g/dL (ref 30.0–36.0)
MCV: 86.2 fL (ref 78.0–100.0)
Platelets: 223 10*3/uL (ref 150–400)
RBC: 3.76 MIL/uL — AB (ref 3.87–5.11)
RDW: 13.5 % (ref 11.5–15.5)
WBC: 9.5 10*3/uL (ref 4.0–10.5)

## 2017-05-01 NOTE — MAU Provider Note (Signed)
History     CSN: [id number]  Arrival date and time: 05/01/17 1152  Chief Complaint  Patient presents with  . Decreased Fetal Movement  . Numbness   G1 @34 .4 wks here with decreased FM, no energy, and right leg numbness. Dec FM started over the weekend. She's had episodes before, most recent about 1 month ago. Numbness has been ongoing but is now only when she stands and even for short periods of time. Also c/o no energy, fatigues easily. Denies VB, LOF, and ctx.  OB History    Gravida  1   Para  0   Term  0   Preterm  0   AB  0   Living  0     SAB  0   TAB  0   Ectopic  0   Multiple  0   Live Births  0           Past Medical History:  Diagnosis Date  . Infection    UTI  . Migraines     Past Surgical History:  Procedure Laterality Date  . TONSILLECTOMY AND ADENOIDECTOMY Bilateral 09/30/2014   Procedure: BILATERAL TONSILLECTOMY AND ADENOIDECTOMY;  Surgeon: Newman Pies, MD;  Location: [facility];  Service: ENT;  Laterality: Bilateral;    Family History  Problem Relation Age of Onset  . Kidney disease Mother   . Anemia Mother   . Depression Father   . Mental illness Father   . Hypertension Father   . Hyperlipidemia Father   . Asthma Brother   . Cancer Paternal Uncle        colon  . Diabetes Maternal Grandmother   . Kidney disease Maternal Grandmother     Social History   Tobacco Use  . Smoking status: Never Smoker  . Smokeless tobacco: Never Used  . Tobacco comment: hookah, over a year ago, rare  Substance Use Topics  . Alcohol use: No    Alcohol/week: 0.0 oz  . Drug use: No    Allergies: No Known Allergies  Medications Prior to Admission  Medication Sig Dispense Refill Last Dose  . Prenatal Vit-Fe Fumarate-FA (PRENATAL MULTIVITAMIN) TABS tablet Take 1 tablet by mouth at bedtime.   04/30/2017 at Unknown time  . methylPREDNISolone (MEDROL DOSEPAK) 4 MG TBPK tablet Take 6-5-4-3-2-1 po qd (Patient not taking: Reported on  04/01/2017) 21 tablet 0 Completed Course at Unknown time    Review of Systems  Constitutional: Positive for fatigue.  Gastrointestinal: Negative for abdominal pain.  Genitourinary: Negative for vaginal bleeding.  Neurological: Positive for light-headedness.   Physical Exam   Blood pressure 110/70, pulse 77, temperature 98.6 F (37 C), temperature source Oral, resp. rate 16, weight 196 lb 4 oz (89 kg), last menstrual period 09/01/2016, SpO2 100 %.  Physical Exam  Constitutional: She appears well-developed and well-nourished. No distress.  HENT:  Head: Normocephalic and atraumatic.  Neck: Normal range of motion.  Cardiovascular: Normal rate.  Respiratory: Effort normal. No respiratory distress.  GI: Soft. She exhibits no distension. There is no tenderness.  gravid  Musculoskeletal: Normal range of motion.  Neurological: She is alert.  Skin: Skin is warm and dry.  Psychiatric: She has a normal mood and affect.  EFM: 130 bpm, mod variability, + accels, no decels Toco: irregular  Results for orders placed or performed during the hospital encounter of 05/01/17 (from the past 24 hour(s))  CBC     Status: Abnormal   Collection Time: 05/01/17  1:10 PM  Result Value Ref Range   WBC 9.5 4.0 - 10.5 K/uL   RBC 3.76 (L) 3.87 - 5.11 MIL/uL   Hemoglobin 10.8 (L) 12.0 - 15.0 g/dL   HCT 16.132.4 (L) 09.636.0 - 04.546.0 %   MCV 86.2 78.0 - 100.0 fL   MCH 28.7 26.0 - 34.0 pg   MCHC 33.3 30.0 - 36.0 g/dL   RDW 40.913.5 81.111.5 - 91.415.5 %   Platelets 223 150 - 400 K/uL   MAU Course  Procedures  MDM Labs ordered and reviewed. Numbness likely d/t fetal position (fetus deviated to right side). BPP 8/8, AFI 13. Presentation, clinical findings, and plan discussed with Dr. Richardson Doppole. Stable for discharge home.  Assessment and Plan   1. NST (non-stress test) reactive   2. Decreased fetal movement   3. [redacted] weeks gestation of pregnancy    Discharge home [facility]  Allergies as of 05/01/2017   No Known Allergies     Medication List    STOP taking these medications   methylPREDNISolone 4 MG Tbpk tablet Commonly known as:  MEDROL DOSEPAK     TAKE these medications   prenatal multivitamin Tabs tablet Take 1 tablet by mouth at bedtime.      Donette LarryMelanie Sabrea Sankey, CNM 05/01/2017, 2:57 PM

## 2017-05-01 NOTE — MAU Note (Signed)
Haven't felt the baby move much this past weekend.  Feeling numbness on right side of body, when she stands.  No energy

## 2017-05-01 NOTE — Discharge Instructions (Signed)

## 2017-05-01 NOTE — MAU Note (Signed)
Urine in lab 

## 2017-05-17 LAB — OB RESULTS CONSOLE GBS: STREP GROUP B AG: NEGATIVE

## 2017-06-06 ENCOUNTER — Telehealth (HOSPITAL_COMMUNITY): Payer: Self-pay | Admitting: *Deleted

## 2017-06-06 NOTE — Telephone Encounter (Signed)
Preadmission screen  

## 2017-06-13 ENCOUNTER — Inpatient Hospital Stay (HOSPITAL_COMMUNITY)
Admission: AD | Admit: 2017-06-13 | Discharge: 2017-06-18 | DRG: 806 | Disposition: A | Discharge status: Home / Self Care | Payer: Medicaid Other | Source: Ambulatory Visit | Attending: Obstetrics & Gynecology | Admitting: Obstetrics & Gynecology

## 2017-06-13 ENCOUNTER — Encounter (HOSPITAL_COMMUNITY): Payer: Self-pay | Admitting: *Deleted

## 2017-06-13 ENCOUNTER — Other Ambulatory Visit: Payer: Self-pay | Admitting: Obstetrics and Gynecology

## 2017-06-13 DIAGNOSIS — B3731 Acute candidiasis of vulva and vagina: Secondary | ICD-10-CM

## 2017-06-13 DIAGNOSIS — B373 Candidiasis of vulva and vagina: Secondary | ICD-10-CM

## 2017-06-13 DIAGNOSIS — O480 Post-term pregnancy: Principal | ICD-10-CM | POA: Diagnosis present

## 2017-06-13 DIAGNOSIS — Z3A41 41 weeks gestation of pregnancy: Secondary | ICD-10-CM

## 2017-06-13 DIAGNOSIS — Z9289 Personal history of other medical treatment: Secondary | ICD-10-CM

## 2017-06-13 DIAGNOSIS — O4103X1 Oligohydramnios, third trimester, fetus 1: Secondary | ICD-10-CM | POA: Diagnosis present

## 2017-06-13 DIAGNOSIS — O4103X0 Oligohydramnios, third trimester, not applicable or unspecified: Secondary | ICD-10-CM | POA: Diagnosis present

## 2017-06-13 LAB — CBC
HEMATOCRIT: 36 % (ref 36.0–46.0)
Hemoglobin: 11.5 g/dL — ABNORMAL LOW (ref 12.0–15.0)
MCH: 28.4 pg (ref 26.0–34.0)
MCHC: 31.9 g/dL (ref 30.0–36.0)
MCV: 88.9 fL (ref 78.0–100.0)
PLATELETS: 245 10*3/uL (ref 150–400)
RBC: 4.05 MIL/uL (ref 3.87–5.11)
RDW: 14.5 % (ref 11.5–15.5)
WBC: 8.3 10*3/uL (ref 4.0–10.5)

## 2017-06-13 LAB — TYPE AND SCREEN
ABO/RH(D): O POS
ANTIBODY SCREEN: NEGATIVE

## 2017-06-13 LAB — ABO/RH: ABO/RH(D): O POS

## 2017-06-13 MED ORDER — OXYTOCIN BOLUS FROM INFUSION: 500.0000 mL | Freq: Once | INTRAVENOUS | Status: DC

## 2017-06-13 MED ORDER — ONDANSETRON HCL 4 MG/2ML IJ SOLN: 4.0000 mg | Freq: Four times a day (QID) | INTRAMUSCULAR | Status: DC | PRN

## 2017-06-13 MED ORDER — OXYTOCIN 40 UNITS IN LACTATED RINGERS INFUSION - SIMPLE MED: 2.5000 [IU]/h | INTRAVENOUS | Status: DC

## 2017-06-13 MED ORDER — LACTATED RINGERS IV SOLN
500.0000 mL | INTRAVENOUS | Status: DC | PRN
  Administered 2017-06-13 – 2017-06-15 (×6): 500 mL via INTRAVENOUS

## 2017-06-13 MED ORDER — FENTANYL CITRATE (PF) 100 MCG/2ML IJ SOLN
50.0000 ug | INTRAMUSCULAR | Status: DC | PRN
  Administered 2017-06-14: 100 ug via INTRAVENOUS
  Filled 2017-06-13: qty 2

## 2017-06-13 MED ORDER — ZOLPIDEM TARTRATE 5 MG PO TABS: 5.0000 mg | ORAL_TABLET | Freq: Every evening | ORAL | Status: DC | PRN

## 2017-06-13 MED ORDER — TERBUTALINE SULFATE 1 MG/ML IJ SOLN
0.2500 mg | Freq: Once | INTRAMUSCULAR | Status: DC | PRN
  Filled 2017-06-13: qty 1

## 2017-06-13 MED ORDER — ACETAMINOPHEN 325 MG PO TABS
650.0000 mg | ORAL_TABLET | ORAL | Status: DC | PRN
  Administered 2017-06-15: 650 mg via ORAL
  Filled 2017-06-13 (×2): qty 2

## 2017-06-13 MED ORDER — OXYCODONE-ACETAMINOPHEN 5-325 MG PO TABS: 2.0000 | ORAL_TABLET | ORAL | Status: DC | PRN

## 2017-06-13 MED ORDER — OXYCODONE-ACETAMINOPHEN 5-325 MG PO TABS: 1.0000 | ORAL_TABLET | ORAL | Status: DC | PRN

## 2017-06-13 MED ORDER — MISOPROSTOL 25 MCG QUARTER TABLET
25.0000 ug | ORAL_TABLET | ORAL | Status: DC
Start: 2017-06-13 — End: 2017-06-16
  Administered 2017-06-13 – 2017-06-14 (×3): 25 ug via VAGINAL
  Filled 2017-06-13 (×6): qty 1

## 2017-06-13 MED ORDER — LIDOCAINE HCL (PF) 1 % IJ SOLN
30.0000 mL | INTRAMUSCULAR | Status: DC | PRN
  Administered 2017-06-16: 30 mL via SUBCUTANEOUS
  Filled 2017-06-13: qty 30

## 2017-06-13 MED ORDER — SOD CITRATE-CITRIC ACID 500-334 MG/5ML PO SOLN: 30.0000 mL | ORAL | Status: DC | PRN

## 2017-06-13 MED ORDER — LACTATED RINGERS IV SOLN
INTRAVENOUS | Status: DC
  Administered 2017-06-13: 22:00:00 via INTRAVENOUS
  Administered 2017-06-13: 125 mL/h via INTRAVENOUS
  Administered 2017-06-14 (×2): via INTRAVENOUS
  Administered 2017-06-14: 125 mL/h via INTRAVENOUS
  Administered 2017-06-15 (×2): via INTRAVENOUS

## 2017-06-13 NOTE — H&P (Signed)
Connie Medina is a 27 y.o. female  G1P0 at 40 wks and 5 days admitted for induction. She was Seen in the office today had bpp for postdates. Bpp was 2 out of 8... AFI 3 cm.  No movement or tone seen. .. Fetal Breathing seen... Patient reports movement felt  After ultrasound was completed but none during the ultrasound.. She has not eaten today she has only had fluids.   Pelvic ultrasound today 06/13/2017... EFW 6 lbs 11 oz ( 25%ile)   OB History    Gravida  1   Para  0   Term  0   Preterm  0   AB  0   Living  0     SAB  0   TAB  0   Ectopic  0   Multiple  0   Live Births  0          Past Medical History:  Diagnosis Date  . Infection    UTI  . Migraines    Past Surgical History:  Procedure Laterality Date  . TONSILLECTOMY AND ADENOIDECTOMY Bilateral 09/30/2014   Procedure: BILATERAL TONSILLECTOMY AND ADENOIDECTOMY;  Surgeon: Newman Pies, MD;  Location: [facility];  Service: ENT;  Laterality: Bilateral;   Family History: family history includes Anemia in her mother; Asthma in her brother; Cancer in her paternal uncle; Depression in her father; Diabetes in her maternal grandmother; Hyperlipidemia in her father; Hypertension in her father; Kidney disease in her maternal grandmother and mother; Mental illness in her father. Social History:  reports that she has never smoked. She has never used smokeless tobacco. She reports that she does not drink alcohol or use drugs.     Maternal Diabetes: No Genetic Screening: Declined Maternal Ultrasounds/Referrals: Abnormal:  Findings:   Echogenic bowel on ultrasound 06/13/2017 Fetal Ultrasounds or other Referrals:  None Maternal Substance Abuse:  No Significant Maternal Medications:  None Significant Maternal Lab Results:  Lab values include: Group B Strep negative Other Comments:  enlarged bladder and echogenic bowel seen on ultraosund for post dates 06/13/2017  Review of Systems  Constitutional: Negative.   HENT:  Negative.   Eyes: Negative.   Respiratory: Negative.   Cardiovascular: Negative.   Gastrointestinal: Negative.   Genitourinary: Negative.   Musculoskeletal: Negative.   Skin: Negative.   Neurological: Negative.   Endo/Heme/Allergies: Negative.   Psychiatric/Behavioral: Negative.    Maternal Medical History:  Fetal activity: Perceived fetal activity is decreased.   Last perceived fetal movement was within the past hour.    Prenatal complications: no prenatal complications Prenatal Complications - Diabetes: none.      Last menstrual period 09/01/2016. Maternal Exam:  Abdomen: Estimated fetal weight is  6 lbs 11 oz (25%ile) .   Fetal presentation: vertex  Introitus: Normal vulva. Pelvis: adequate for delivery.      Physical Exam  Vitals reviewed. Constitutional: She appears well-developed and well-nourished.  HENT:  Head: Atraumatic.  Eyes: Pupils are equal, round, and reactive to light. Conjunctivae are normal.  Neck: Normal range of motion. Neck supple.  Cardiovascular: Normal rate and regular rhythm.  Respiratory: Effort normal and breath sounds normal.  GI: There is no tenderness.  Genitourinary: Vagina normal.  Genitourinary Comments: Cervix is closed on exam   Musculoskeletal: Normal range of motion. She exhibits edema.  Neurological: She is alert.  Skin: Skin is warm and dry.  Psychiatric: She has a normal mood and affect.    Prenatal labs: ABO, Rh: O/Positive/-- (09/26 0000) Antibody:  Negative (09/26 0000) Rubella: Immune (09/26 0000) RPR: Nonreactive (09/26 0000)  HBsAg: Negative (09/26 0000)  HIV: Non-reactive (09/26 0000)  GBS: Negative (04/17 0000)   Assessment/Plan: 40 wks and 5 days post dates with nonreassuring biophysical profile 2/8 Admit to labor and delivery for continuous montioring. If fhr reassuring plan induction with cytotec.  If fhr is nonreassuring plan cesarean section.  IV fluids.     Criag Wicklund J. 06/13/2017, 2:37 PM

## 2017-06-13 NOTE — Anesthesia Pain Management Evaluation Note (Signed)
  CRNA Pain Management Visit Note  Patient: Connie Medina, 27 y.o., female  "Hello I am a member of the anesthesia team at [facility]. We have an anesthesia team available at all times to provide care throughout the hospital, including epidural management and anesthesia for C-section. I don't know your plan for the delivery whether it a natural birth, water birth, IV sedation, nitrous supplementation, doula or epidural, but we want to meet your pain goals."   1.Was your pain managed to your expectations on prior hospitalizations?   No prior hospitalizations  2.What is your expectation for pain management during this hospitalization?     Labor support without medications, Epidural and IV pain meds  3.How can we help you reach that goal? Be available, planning epidural  Record the patient's initial score and the patient's pain goal.   Pain: 0  Pain Goal: 5 [facility] wants you to be able to say your pain was always managed very well.  [facility] 06/13/2017

## 2017-06-13 NOTE — Progress Notes (Signed)
Connie Medina is a 27 y.o. G1P0000 at [redacted]w[redacted]d  admitted for induction of labor due to abnormal BPP 2 out of 8.  Subjective: Patient report feeling movement no lof no vaginal bleeding no contractions    Objective: BP 118/80   Pulse 78   Temp 98.1 F (36.7 C) (Oral)   Resp 16   Ht  (1.6 m)   Wt 90.7 kg (200 lb)   LMP 09/01/2016   BMI 35.43 kg/m  No intake/output data recorded. No intake/output data recorded.  FHT:  FHR: 130 bpm, variability: moderate,  accelerations:  Present,  decelerations:  Absent UC:   none SVE:   Dilation: Closed Exam by:: Dr. Richardson Dopp cytotec placed   Labs: Lab Results  Component Value Date   WBC 8.3 06/13/2017   HGB 11.5 (L) 06/13/2017   HCT 36.0 06/13/2017   MCV 88.9 06/13/2017   PLT 245 06/13/2017    Assessment / Plan: induction of labor due to nonreassuring fetal testing post dates   Labor: cytotec check cervix every 4 hours  Preeclampsia:  NA Fetal Wellbeing:  Category I Pain Control:  planning epidural  I/D:  n/a Anticipated MOD:  NSVD as long as fhr is reassuring   Mikhaila Roh J. 06/13/2017, 5:53 PM

## 2017-06-14 ENCOUNTER — Inpatient Hospital Stay (HOSPITAL_COMMUNITY): Payer: Medicaid Other | Admitting: Anesthesiology

## 2017-06-14 ENCOUNTER — Inpatient Hospital Stay (HOSPITAL_COMMUNITY): Admission: RE | Admit: 2017-06-14 | Payer: Medicaid Other | Source: Ambulatory Visit

## 2017-06-14 LAB — CBC
HEMATOCRIT: 35.6 % — AB (ref 36.0–46.0)
HEMOGLOBIN: 11.8 g/dL — AB (ref 12.0–15.0)
MCH: 29.3 pg (ref 26.0–34.0)
MCHC: 33.1 g/dL (ref 30.0–36.0)
MCV: 88.3 fL (ref 78.0–100.0)
Platelets: 214 10*3/uL (ref 150–400)
RBC: 4.03 MIL/uL (ref 3.87–5.11)
RDW: 14.7 % (ref 11.5–15.5)
WBC: 9.3 10*3/uL (ref 4.0–10.5)

## 2017-06-14 LAB — RPR: RPR Ser Ql: NONREACTIVE

## 2017-06-14 MED ORDER — EPHEDRINE 5 MG/ML INJ
10.0000 mg | INTRAVENOUS | Status: DC | PRN
  Filled 2017-06-14: qty 2

## 2017-06-14 MED ORDER — PHENYLEPHRINE 40 MCG/ML (10ML) SYRINGE FOR IV PUSH (FOR BLOOD PRESSURE SUPPORT)
80.0000 ug | PREFILLED_SYRINGE | INTRAVENOUS | Status: DC | PRN
  Filled 2017-06-14: qty 5

## 2017-06-14 MED ORDER — PHENYLEPHRINE 40 MCG/ML (10ML) SYRINGE FOR IV PUSH (FOR BLOOD PRESSURE SUPPORT)
80.0000 ug | PREFILLED_SYRINGE | INTRAVENOUS | Status: DC | PRN
  Filled 2017-06-14: qty 10
  Filled 2017-06-14: qty 5

## 2017-06-14 MED ORDER — OXYTOCIN 40 UNITS IN LACTATED RINGERS INFUSION - SIMPLE MED
1.0000 m[IU]/min | INTRAVENOUS | Status: DC
  Administered 2017-06-14: 10 m[IU]/min via INTRAVENOUS
  Administered 2017-06-14: 1 m[IU]/min via INTRAVENOUS
  Filled 2017-06-14: qty 1000

## 2017-06-14 MED ORDER — OXYTOCIN 40 UNITS IN LACTATED RINGERS INFUSION - SIMPLE MED
1.0000 m[IU]/min | INTRAVENOUS | Status: DC
  Administered 2017-06-15: 24 m[IU]/min via INTRAVENOUS
  Filled 2017-06-14: qty 1000

## 2017-06-14 MED ORDER — LIDOCAINE HCL (PF) 1 % IJ SOLN
INTRAMUSCULAR | Status: DC | PRN
  Administered 2017-06-14: 6 mL via EPIDURAL

## 2017-06-14 MED ORDER — LACTATED RINGERS IV SOLN
500.0000 mL | Freq: Once | INTRAVENOUS | Status: AC
  Administered 2017-06-14: 500 mL via INTRAVENOUS

## 2017-06-14 MED ORDER — SODIUM CHLORIDE 0.9 % IV SOLN
3.0000 g | Freq: Four times a day (QID) | INTRAVENOUS | Status: DC
  Administered 2017-06-15 (×4): 3 g via INTRAVENOUS
  Filled 2017-06-14 (×7): qty 3

## 2017-06-14 MED ORDER — TERBUTALINE SULFATE 1 MG/ML IJ SOLN
0.2500 mg | Freq: Once | INTRAMUSCULAR | Status: DC | PRN
  Filled 2017-06-14: qty 1

## 2017-06-14 MED ORDER — LACTATED RINGERS IV SOLN: 500.0000 mL | Freq: Once | INTRAVENOUS | Status: DC

## 2017-06-14 MED ORDER — FENTANYL 2.5 MCG/ML BUPIVACAINE 1/10 % EPIDURAL INFUSION (WH - ANES)
14.0000 mL/h | INTRAMUSCULAR | Status: DC | PRN
  Administered 2017-06-14 – 2017-06-15 (×6): 14 mL/h via EPIDURAL
  Filled 2017-06-14 (×6): qty 100

## 2017-06-14 MED ORDER — DIPHENHYDRAMINE HCL 50 MG/ML IJ SOLN: 12.5000 mg | INTRAMUSCULAR | Status: DC | PRN

## 2017-06-14 NOTE — Progress Notes (Signed)
Connie Medina is a 27 y.o. G1P0000 at [redacted]w[redacted]d by  admitted for induction of labor due to abnormal BPP 2 out 8  Subjective: Patient reports Rupture at 2 am this morning. Was bloody at first and then clear per patient,. +FM she is not feeling contractions currently.   Objective: BP 112/67   Pulse 64   Temp 98.1 F (36.7 C) (Oral)   Resp 16   Ht  (1.6 m)   Wt 90.7 kg (200 lb)   LMP 09/01/2016   BMI 35.43 kg/m  No intake/output data recorded. No intake/output data recorded.  FHT:  FHR: 130 bpm, variability: moderate,  accelerations:  Present,  decelerations:  Absent UC:   Irregular  SVE:   Dilation: 1.5 Effacement (%): 80 Station: -1 Exam by:: Dr. Richardson Dopp  Labs: Lab Results  Component Value Date   WBC 8.3 06/13/2017   HGB 11.5 (L) 06/13/2017   HCT 36.0 06/13/2017   MCV 88.9 06/13/2017   PLT 245 06/13/2017    Assessment / Plan: induction of labor due to postdates and nonreassuring fetal testing.   Labor: continue pitocin  Preeclampsia:  NA Fetal Wellbeing:  Category I Pain Control:  planning an epidural  I/D:  n/a Anticipated MOD:  NSVD  Trevious Rampey J. 06/14/2017, 1:03 PM

## 2017-06-14 NOTE — Progress Notes (Signed)
   Subjective: Continued lof . Starting to feel contractions.   Objective: BP 112/67   Pulse 64   Temp 98.1 F (36.7 C) (Oral)   Resp 16   Ht  (1.6 m)   Wt 90.7 kg (200 lb)   LMP 09/01/2016   BMI 35.43 kg/m  No intake/output data recorded. No intake/output data recorded.  FHT:  FHR: 130 bpm, variability: moderate,  accelerations:  Present,  decelerations:  Absent UC:   irregular, every 2-5 minutes SVE:   Dilation: 1.5 Effacement (%): 80 Station: -1 Exam by:: Dr. Richardson Dopp IUPC placed   Labs: Lab Results  Component Value Date   WBC 8.3 06/13/2017   HGB 11.5 (L) 06/13/2017   HCT 36.0 06/13/2017   MCV 88.9 06/13/2017   PLT 245 06/13/2017    Assessment / Plan: induction of labor due to nonreassuring fetal testing   Labor: continue pitocin to reach mvu's 200  Preeclampsia:  NA Fetal Wellbeing:  Category I Pain Control:  planning an epidural  I/D:  n/a Anticipated MOD:  NSVD  Connie Medina J. 06/14/2017, 1:28 PM

## 2017-06-14 NOTE — Anesthesia Preprocedure Evaluation (Signed)
Anesthesia Evaluation  Patient identified by MRN, date of birth, ID band Patient awake    Reviewed: Allergy & Precautions, NPO status , Patient's Chart, lab work & pertinent test results  Airway Mallampati: II  TM Distance: >3 FB Neck ROM: Full    Dental  (+) Teeth Intact, Dental Advisory Given   Pulmonary neg pulmonary ROS,    Pulmonary exam normal breath sounds clear to auscultation       Cardiovascular Exercise Tolerance: Good negative cardio ROS Normal cardiovascular exam Rhythm:Regular Rate:Normal     Neuro/Psych  Headaches, negative psych ROS   GI/Hepatic negative GI ROS,   Endo/Other    Renal/GU      Musculoskeletal   Abdominal (+) + obese,   Peds  Hematology   Anesthesia Other Findings   Reproductive/Obstetrics (+) Pregnancy                             Lab Results  Component Value Date   WBC 9.3 06/14/2017   HGB 11.8 (L) 06/14/2017   HCT 35.6 (L) 06/14/2017   MCV 88.3 06/14/2017   PLT 214 06/14/2017    Anesthesia Physical Anesthesia Plan  ASA: III  Anesthesia Plan: Epidural   Post-op Pain Management:    Induction:   PONV Risk Score and Plan:   Airway Management Planned: Nasal Cannula  Additional Equipment:   Intra-op Plan:   Post-operative Plan:   Informed Consent: I have reviewed the patients History and Physical, chart, labs and discussed the procedure including the risks, benefits and alternatives for the proposed anesthesia with the patient or authorized representative who has indicated his/her understanding and acceptance.     Plan Discussed with:   Anesthesia Plan Comments:         Anesthesia Quick Evaluation

## 2017-06-14 NOTE — Anesthesia Procedure Notes (Signed)
Epidural Patient location during procedure: OB Start time: 06/14/2017 4:05 PM End time: 06/14/2017 4:22 PM  Staffing Anesthesiologist: Trevor Iha, MD Performed: anesthesiologist   Preanesthetic Checklist Completed: patient identified, site marked, surgical consent, pre-op evaluation, timeout performed, IV checked, risks and benefits discussed and monitors and equipment checked  Epidural Patient position: sitting Prep: site prepped and draped and DuraPrep Patient monitoring: continuous pulse ox and blood pressure Approach: midline Location: L3-L4 Injection technique: LOR air  Needle:  Needle type: Tuohy  Needle gauge: 17 G Needle length: 9 cm and 9 Needle insertion depth: 7 cm Catheter type: closed end flexible Catheter size: 19 Gauge Catheter at skin depth: 12 cm Test dose: negative  Assessment Events: blood not aspirated, injection not painful, no injection resistance, negative IV test and no paresthesia

## 2017-06-14 NOTE — Progress Notes (Signed)
This note also relates to the following rows which could not be included: Dose (milli-units/min) Oxytocin - Cannot attach notes to extension rows Rate (mL/hr) Oxytocin - Cannot attach notes to extension rows Concentration Oxytocin - Cannot attach notes to extension rows  Pt asleep at this time

## 2017-06-14 NOTE — Progress Notes (Signed)
  Subjective: Comfortable with epidural .. Continued leakage of fluid   Objective: BP 106/69   Pulse 66   Temp 98.1 F (36.7 C) (Oral)   Resp 16   Ht  (1.6 m)   Wt 90.7 kg (200 lb)   LMP 09/01/2016   BMI 35.43 kg/m  No intake/output data recorded. No intake/output data recorded.  FHT:  FHR: 130 bpm, variability: moderate,  accelerations:  Present,  decelerations:  Absent UC:   regular, every 2-3 minutes SVE:   Dilation: 3 Effacement (%): 80 Station: -2 Exam by:: Dr. Richardson Dopp  Labs: Lab Results  Component Value Date   WBC 9.3 06/14/2017   HGB 11.8 (L) 06/14/2017   HCT 35.6 (L) 06/14/2017   MCV 88.3 06/14/2017   PLT 214 06/14/2017    Assessment / Plan: Induction of labor due to non-reassuring fetal testing,  progressing well on pitocin  Labor: continue pitocin  Preeclampsia:  labs stable Fetal Wellbeing:  Category I Pain Control:  Epidural I/D:  n/a Anticipated MOD:  NSVD  CCOB/ [name] CNM and Dr. Normand Sloop covering after 7 pm .. I will assume care ot 7 am   Connie Shams J. 06/14/2017, 6:44 PM

## 2017-06-14 NOTE — Progress Notes (Signed)
Spoke to Dr. Richardson Dopp via phone to report contraction pattern. Order given to continue Pitocin per order

## 2017-06-15 MED ORDER — MISOPROSTOL 200 MCG PO TABS
ORAL_TABLET | ORAL | Status: AC
  Filled 2017-06-15: qty 5

## 2017-06-15 MED ORDER — CARBOPROST TROMETHAMINE 250 MCG/ML IM SOLN
INTRAMUSCULAR | Status: AC
  Filled 2017-06-15: qty 1

## 2017-06-15 MED ORDER — METHYLERGONOVINE MALEATE 0.2 MG/ML IJ SOLN
INTRAMUSCULAR | Status: AC
  Filled 2017-06-15: qty 1

## 2017-06-15 NOTE — Progress Notes (Signed)
Subjective: Pt comfortable with epidural.  RN to start antibiotics  Objective: BP 115/64   Pulse 61   Temp 98.4 F (36.9 C) (Oral)   Resp 16   Ht  (1.6 m)   Wt 90.7 kg (200 lb)   LMP 09/01/2016   BMI 35.43 kg/m  No intake/output data recorded. Total I/O In: -  Out: 900 [Urine:900]  FHT: Category 1 UC:   regular, every 3-4 minutes SVE:   Dilation: 3 Effacement (%): 80 Station: -2 Exam by:: Bernerd Pho, CNM Pitocin at 10 u   Assessment:  Induction of labor due to non-reassuring fetal testing, Cat one strip  Plan: Continue to monitor progress  Kenney Houseman CNM, MSN 06/15/2017, 1:31 AM

## 2017-06-15 NOTE — Progress Notes (Signed)
Pharmacy Antibiotic Note  Connie Medina is a 27 y.o. female admitted on 06/13/2017 for IOL for post dates.  Pt is now >24 hours SROM. Pharmacy has been consulted for Unasyn dosing.  Plan: Unasyn 3 Gm IV every 6 hours.  Height:  (160 cm) Weight: 200 lb (90.7 kg) IBW/kg (Calculated) : 52.4  Temp (24hrs), Avg:98 F (36.7 C), Min:97.6 F (36.4 C), Max:98.4 F (36.9 C)  Recent Labs  Lab 06/13/17 1539 06/14/17 1534  WBC 8.3 9.3    CrCl cannot be calculated (No order found.).    No Known Allergies  Dose adjustments this admission: N/A  Thank you for allowing pharmacy to be a part of this patient's care.  Arelia Sneddon 06/15/2017 12:30 AM

## 2017-06-15 NOTE — Progress Notes (Addendum)
  Subjective: Pt feeling increased pelvic pressure on her lower abdomen   Objective: BP 122/65   Pulse 78   Temp 98.8 F (37.1 C) (Oral)   Resp 18   Ht  (1.6 m)   Wt 90.7 kg (200 lb)   LMP 09/01/2016   SpO2 100%   BMI 35.43 kg/m  I/O last 3 completed shifts: In: -  Out: 1500 [Urine:1500] Total I/O In: -  Out: 300 [Urine:300]  FHT:  145, moderate variability, no accels, UC:   irregular, every 2-5 minutes SVE: ant lip/100/+1 Labs: Lab Results  Component Value Date   WBC 9.3 06/14/2017   HGB 11.8 (L) 06/14/2017   HCT 35.6 (L) 06/14/2017   MCV 88.3 06/14/2017   PLT 214 06/14/2017    Assessment / Plan: 27yo G1P0@[redacted]w[redacted]d  for IOL due to abnormal BPP -FWB: Cat. I -Labor: continue Pit per protocol, pt to labor down small area of cervix still noted -Pain management: plan to re-dose epidural -continue IV Unasyn for prolonged rupture   Sharon Seller 06/15/2017, 5:54 PM

## 2017-06-15 NOTE — Progress Notes (Signed)
Connie Medina is a 27 y.o. G1P0000 at [redacted]w[redacted]d being induced for a bpp 2/8.   Subjective: Patient feels pelvic pain.   Objective: BP 124/80   Pulse 74   Temp 98.4 F (36.9 C) (Oral)   Resp 16   Ht  (1.6 m)   Wt 90.7 kg (200 lb)   LMP 09/01/2016   SpO2 100%   BMI 35.43 kg/m  I/O last 3 completed shifts: In: -  Out: 1800 [Urine:1800] No intake/output data recorded.  FHT:  Cat 1 UC:   regular, every 2 to 3 minutes SVE:   Dilation: 10 Effacement (%): 100 Station: Plus 1 Exam by:: Sallye Ober, MD  Labs: Lab Results  Component Value Date   WBC 9.3 06/14/2017   HGB 11.8 (L) 06/14/2017   HCT 35.6 (L) 06/14/2017   MCV 88.3 06/14/2017   PLT 214 06/14/2017    Assessment / Plan: Induction of labor, fully dilated  Labor: Labor down for about an hour then push.  Fetal Wellbeing:  Category I Pain Control:  Epidural I/D:  On Unasyn per provider before.  Anticipated MOD:  NSVD  Konrad Felix, MD.  06/15/2017, 10:37 PM

## 2017-06-15 NOTE — Progress Notes (Signed)
  Subjective: Patient is comfortable with epidural   Objective: BP 102/63   Pulse 78   Temp 98.9 F (37.2 C) (Oral)   Resp 16   Ht  (1.6 m)   Wt 90.7 kg (200 lb)   LMP 09/01/2016   BMI 35.43 kg/m  I/O last 3 completed shifts: In: -  Out: 1500 [Urine:1500] No intake/output data recorded.  FHT:  FHR: 130 bpm, variability: moderate,  accelerations:  Present,  decelerations:  Absent UC:   regular, every 2 minutes SVE: 7/90/-1 caput Labs: Lab Results  Component Value Date   WBC 9.3 06/14/2017   HGB 11.8 (L) 06/14/2017   HCT 35.6 (L) 06/14/2017   MCV 88.3 06/14/2017   PLT 214 06/14/2017    Assessment / Plan: Induction of labor due to non-reassuring fetal testing,  progressing well on pitocin  Labor: progressing on pitocin Preeclampsia:  NA Fetal Wellbeing:  Category I Pain Control:  Epidural I/D:  unasyn for prolonged rupture Anticipated MOD:  NSVD  Aily Tzeng J. 06/15/2017, 7:53 AM

## 2017-06-15 NOTE — Progress Notes (Signed)
  Subjective: Called to see patient due to reports of repetitive late decelertions. Pt is comfortable with her epidural.    Objective: BP 114/84   Pulse 90   Temp 98.8 F (37.1 C) (Oral)   Resp 18   Ht  (1.6 m)   Wt 90.7 kg (200 lb)   LMP 09/01/2016   SpO2 100%   BMI 35.43 kg/m  I/O last 3 completed shifts: In: -  Out: 1500 [Urine:1500] Total I/O In: -  Out: 200 [Urine:200]  FHT:  FHR: 130 bpm, variability: moderate,  accelerations:  Present,  decelerations:  Present variable /late  UC:   irregular, every 2-5 minutes SVE: 10/100/0 station  Labs: Lab Results  Component Value Date   WBC 9.3 06/14/2017   HGB 11.8 (L) 06/14/2017   HCT 35.6 (L) 06/14/2017   MCV 88.3 06/14/2017   PLT 214 06/14/2017    Assessment / Plan: Induction of labor due to nonreassuring fetal testing.  Late decelertions resolved with cessation of pitocin/ repositioning/ iv fluid bolus and 02  Pt complete. Will restat pit in 15 minutes if fhr remains reassuring.   Connie Medina J. 06/15/2017, 4:18 PM

## 2017-06-16 ENCOUNTER — Encounter (HOSPITAL_COMMUNITY): Payer: Self-pay

## 2017-06-16 LAB — CBC
HEMATOCRIT: 32.9 % — AB (ref 36.0–46.0)
Hemoglobin: 10.7 g/dL — ABNORMAL LOW (ref 12.0–15.0)
MCH: 28.9 pg (ref 26.0–34.0)
MCHC: 32.5 g/dL (ref 30.0–36.0)
MCV: 88.9 fL (ref 78.0–100.0)
Platelets: 194 10*3/uL (ref 150–400)
RBC: 3.7 MIL/uL — AB (ref 3.87–5.11)
RDW: 14.9 % (ref 11.5–15.5)
WBC: 24.3 10*3/uL — AB (ref 4.0–10.5)

## 2017-06-16 LAB — CORD BLOOD GAS (VENOUS)
Bicarbonate: 16.9 mmol/L — ABNORMAL LOW (ref 20.0–28.0)
Ph Cord Blood (Venous): 7.287 (ref 7.240–7.380)
pCO2 Cord Blood (Venous): 36.6 — ABNORMAL LOW (ref 42.0–56.0)

## 2017-06-16 MED ORDER — MAGNESIUM HYDROXIDE 400 MG/5ML PO SUSP: 30.0000 mL | ORAL | Status: DC | PRN

## 2017-06-16 MED ORDER — ONDANSETRON HCL 4 MG/2ML IJ SOLN: 4.0000 mg | INTRAMUSCULAR | Status: DC | PRN

## 2017-06-16 MED ORDER — OXYCODONE-ACETAMINOPHEN 5-325 MG PO TABS: 2.0000 | ORAL_TABLET | ORAL | Status: DC | PRN

## 2017-06-16 MED ORDER — MISOPROSTOL 200 MCG PO TABS
800.0000 ug | ORAL_TABLET | Freq: Once | ORAL | Status: AC
  Administered 2017-06-16: 800 ug via RECTAL

## 2017-06-16 MED ORDER — ACETAMINOPHEN 325 MG PO TABS
650.0000 mg | ORAL_TABLET | ORAL | Status: DC | PRN
Start: 2017-06-16 — End: 2017-06-18

## 2017-06-16 MED ORDER — IBUPROFEN 600 MG PO TABS
600.0000 mg | ORAL_TABLET | Freq: Four times a day (QID) | ORAL | Status: DC
  Administered 2017-06-16 – 2017-06-18 (×9): 600 mg via ORAL
  Filled 2017-06-16 (×9): qty 1

## 2017-06-16 MED ORDER — OXYTOCIN 10 UNIT/ML IJ SOLN
INTRAMUSCULAR | Status: AC
  Administered 2017-06-16: 10 [IU]
  Filled 2017-06-16: qty 1

## 2017-06-16 MED ORDER — BENZOCAINE-MENTHOL 20-0.5 % EX AERO
1.0000 "application " | INHALATION_SPRAY | CUTANEOUS | Status: DC | PRN
  Administered 2017-06-16: 1 via TOPICAL
  Filled 2017-06-16: qty 56

## 2017-06-16 MED ORDER — OXYCODONE-ACETAMINOPHEN 5-325 MG PO TABS: 1.0000 | ORAL_TABLET | ORAL | Status: DC | PRN

## 2017-06-16 MED ORDER — ZOLPIDEM TARTRATE 5 MG PO TABS: 5.0000 mg | ORAL_TABLET | Freq: Every evening | ORAL | Status: DC | PRN

## 2017-06-16 MED ORDER — COCONUT OIL OIL: 1.0000 "application " | TOPICAL_OIL | Status: DC | PRN

## 2017-06-16 MED ORDER — ONDANSETRON HCL 4 MG PO TABS: 4.0000 mg | ORAL_TABLET | ORAL | Status: DC | PRN

## 2017-06-16 MED ORDER — SIMETHICONE 80 MG PO CHEW
80.0000 mg | CHEWABLE_TABLET | ORAL | Status: DC | PRN
Start: 2017-06-16 — End: 2017-06-18
  Administered 2017-06-18: 80 mg via ORAL
  Filled 2017-06-16: qty 1

## 2017-06-16 MED ORDER — PRENATAL MULTIVITAMIN CH
1.0000 | ORAL_TABLET | Freq: Every day | ORAL | Status: DC
Start: 2017-06-16 — End: 2017-06-18
  Administered 2017-06-16 – 2017-06-17 (×2): 1 via ORAL
  Filled 2017-06-16 (×2): qty 1

## 2017-06-16 MED ORDER — DIBUCAINE 1 % RE OINT: 1.0000 "application " | TOPICAL_OINTMENT | RECTAL | Status: DC | PRN

## 2017-06-16 MED ORDER — DIPHENHYDRAMINE HCL 25 MG PO CAPS: 25.0000 mg | ORAL_CAPSULE | Freq: Four times a day (QID) | ORAL | Status: DC | PRN

## 2017-06-16 MED ORDER — WITCH HAZEL-GLYCERIN EX PADS
1.0000 | MEDICATED_PAD | CUTANEOUS | Status: DC | PRN
Start: 2017-06-16 — End: 2017-06-18

## 2017-06-16 NOTE — Lactation Note (Signed)
This note was copied from a baby's chart. Lactation Consultation Note  Patient Name: Connie Medina [id number] Date: 06/16/2017 Reason for consult: Initial assessment;Primapara;1st time breastfeeding;Term  P1 mother whose infant is 3 hours old.  Mother is doing STS with baby.  She tried to latch him but he was not interested; currently sleeping.  I reviewed STS, feeding 8-12 times/24 hours or earlier if he shows feeding cues, breast massage and hand expression.  Mother understood feeding cues.    Mom made aware of O/P services, breastfeeding support groups, community resources, and our phone # for post-discharge questions. Mother will call for latch assistance as needed.  FOB present.   Maternal Data Formula Feeding for Exclusion: No Has patient been taught Hand Expression?: Yes Does the patient have breastfeeding experience prior to this delivery?: No  Feeding Feeding Type: Breast Fed  LATCH Score                   Interventions    Lactation Tools Discussed/Used     Consult Status Consult Status: Follow-up Date: 06/17/17 Follow-up type: In-patient    Connie Medina 06/16/2017, 4:38 AM

## 2017-06-16 NOTE — Progress Notes (Signed)
Pt in WC with baby in her arms to rm 136 with family and all her belongings

## 2017-06-16 NOTE — Anesthesia Postprocedure Evaluation (Signed)
Anesthesia Post Note  Patient: Connie Medina  Procedure(s) Performed: AN AD HOC LABOR EPIDURAL     Patient location during evaluation: Mother Baby Anesthesia Type: Epidural Level of consciousness: awake and alert, oriented and patient cooperative Pain management: satisfactory to patient Vital Signs Assessment: post-procedure vital signs reviewed and stable Respiratory status: spontaneous breathing Cardiovascular status: stable Postop Assessment: no headache, epidural receding, patient able to bend at knees and no signs of nausea or vomiting Anesthetic complications: no Comments: Pain score 6.  Pt states her pain is manageable.      Last Vitals:  Vitals:   06/16/17 0320 06/16/17 0430  BP: 111/75 116/72  Pulse: 92 71  Resp: 20 18  Temp: 37.4 C 37.1 C  SpO2: 100%     Last Pain:  Vitals:   06/16/17 0630  TempSrc:   PainSc: Asleep   Pain Goal:                 [facility]

## 2017-06-16 NOTE — Lactation Note (Addendum)
This note was copied from a baby's chart. Lactation Consultation Note  Patient Name: Connie Medina Date: 06/16/2017 Reason for consult: Follow-up assessment;1st time breastfeeding;Primapara;Term  Medical secretary called for [facility] assistance. Mom has already decided to supplement baby with formula, RN feeding baby a bottle when entering the room. Baby fell asleep shortly after. Explained to mom that she can still BF while supplementing with formula, and that any amount of breastmilk is still going to be beneficial for her baby. BF basics were reviewed. Mom voiced understanding. She'll call for latch assistance the next time baby is ready to feed, mom aware there is only one LC working during the evening/night shift.  Maternal Data    Feeding Feeding Type: Breast Fed Length of feed: (off and on; hand expression)  LATCH Score Latch: Too sleepy or reluctant, no latch achieved, no sucking elicited.  Audible Swallowing: None  Type of Nipple: Everted at rest and after stimulation  Comfort (Breast/Nipple): Soft / non-tender  Hold (Positioning): Assistance needed to correctly position infant at breast and maintain latch.  LATCH Score: 5  Interventions Interventions: Breast feeding basics reviewed  Lactation Tools Discussed/Used     Consult Status Consult Status: Follow-up Date: 06/17/17 Follow-up type: In-patient    Elric Tirado Venetia Constable 06/16/2017, 9:20 PM

## 2017-06-16 NOTE — Lactation Note (Signed)
This note was copied from a baby's chart. Lactation Consultation Note  Patient Name: Connie Medina [id number]'U Date: 06/16/2017 Reason for consult: Initial assessment Breastfeeding consultation services and support information given to patient.  This is her first baby and newborn is 40 hours old.  Baby at breast in football hold. He suckled a few times on and off but fussy at breast.  Baby would bite gloved finger with no suck elicited.  Mom would like to wait until baby is more ready to feed.  Instructed to watch for feeding cues and call for assist prn.  Maternal Data Has patient been taught Hand Expression?: Yes Does the patient have breastfeeding experience prior to this delivery?: No  Feeding Feeding Type: Breast Fed Length of feed: 0 min  LATCH Score Latch: Repeated attempts needed to sustain latch, nipple held in mouth throughout feeding, stimulation needed to elicit sucking reflex.  Audible Swallowing: None  Type of Nipple: Everted at rest and after stimulation  Comfort (Breast/Nipple): Soft / non-tender  Hold (Positioning): Assistance needed to correctly position infant at breast and maintain latch.  LATCH Score: 6  Interventions Interventions: Breast feeding basics reviewed;Assisted with latch;Breast compression;Skin to skin;Adjust position;Breast massage;Support pillows  Lactation Tools Discussed/Used     Consult Status Consult Status: Follow-up Date: 06/17/17 Follow-up type: In-patient    [name] 06/16/2017, 1:39 PM

## 2017-06-16 NOTE — Progress Notes (Signed)
Pt on stedy to br voided and had BM peri care done

## 2017-06-17 ENCOUNTER — Encounter (HOSPITAL_COMMUNITY): Payer: Self-pay | Admitting: *Deleted

## 2017-06-17 LAB — CBC
HCT: 26.4 % — ABNORMAL LOW (ref 36.0–46.0)
Hemoglobin: 8.5 g/dL — ABNORMAL LOW (ref 12.0–15.0)
MCH: 28.5 pg (ref 26.0–34.0)
MCHC: 32.2 g/dL (ref 30.0–36.0)
MCV: 88.6 fL (ref 78.0–100.0)
PLATELETS: 170 10*3/uL (ref 150–400)
RBC: 2.98 MIL/uL — ABNORMAL LOW (ref 3.87–5.11)
RDW: 15.3 % (ref 11.5–15.5)
WBC: 12.2 10*3/uL — ABNORMAL HIGH (ref 4.0–10.5)

## 2017-06-17 MED ORDER — FERROUS SULFATE 325 (65 FE) MG PO TABS
325.0000 mg | ORAL_TABLET | Freq: Every day | ORAL | Status: DC
  Administered 2017-06-17 – 2017-06-18 (×2): 325 mg via ORAL
  Filled 2017-06-17 (×2): qty 1

## 2017-06-17 NOTE — Lactation Note (Signed)
This note was copied from a baby's chart. Lactation Consultation Note Called to see baby in CN d/t baby unable to hold anything down after feeding. Baby gagging frequently between feedings. Will not suck on bottle nipple, tongue thrust per RN. LC tried suck training w/gloved finger, baby clamped down w/gums, tongue thrust and chewed. Tried to suckle a few times w/tip of tongue.  Baby has thick labial frenulum.  LC held baby in upright position spoon fed w/cheek massage. W/very small drops of formula, occasionally baby would spit out by tongue thrusting, but would at times chew actions and swallow. Took 7 ml slowly, but approx. W/in 3 minutes spit up everything given. Baby has nasal congestion. Abd. Not distended.  RN's watching baby d/t frequent spitting. Baby 27 hrs old.   Patient Name: Connie Medina ZOXWR'U Date: 06/17/2017 Reason for consult: Follow-up assessment;Difficult latch   Maternal Data    Feeding Feeding Type: Formula  LATCH Score                   Interventions    Lactation Tools Discussed/Used     Consult Status Consult Status: Follow-up Date: 06/17/17 Follow-up type: In-patient    Charyl Dancer 06/17/2017, 4:31 AM

## 2017-06-17 NOTE — Progress Notes (Signed)
Postpartum Note Day # 1  S:  Patient resting comfortable in bed.  Pain controlled.  Tolerating general diet. + flatus, + BM.  Lochia moderate.  Ambulating without difficulty.  She denies n/v/f/c, SOB, or CP.  Pt plans on breastfeeding.  O: Temp:  [97.7 F (36.5 C)-97.9 F (36.6 C)] 97.7 F (36.5 C) (05/18 0640) Pulse Rate:  [59-81] 59 (05/18 0640) Resp:  [18] 18 (05/18 0640) BP: (106-119)/(66-79) 106/66 (05/18 0640) SpO2:  [99 %] 99 % (05/18 0640) Gen: A&Ox3, NAD CV: RRR, no MRG Resp: CTAB Abdomen: soft, NT, ND Uterus: firm, non-tender, below umbilicus Ext: No edema, no calf tenderness bilaterally  Labs:  Recent Labs    06/16/17 0221 06/17/17 0611  HGB 10.7* 8.5*    A/P: Pt is a 27 y.o. G1P1001 s/p VAVD, PPD#1  - Pain well controlled -GU: UOP is adequate -GI: Tolerating general diet -Activity: encouraged sitting up to chair and ambulation as tolerated -Prophylaxis: early ambulation -Labs: iron daily -outpatient circ  DISPO: Continue with routine postpartum care  Myna Hidalgo, DO [phone] (pager) [phone] (office)

## 2017-06-17 NOTE — Lactation Note (Signed)
This note was copied from a baby's chart. Lactation Consultation Note  Patient Name: Connie Medina VWUJW'J Date: 06/17/2017 Reason for consult: Follow-up assessment  RN requested assistance with latching because baby is still spitty, gagging frequently and unable to sustain latch at the breast. RN attempted with 5 french and curved tip syringe as did LC. Baby is tongue thrusting and has disorganized suck and after a ml he quickly started to gag. LC attempted at breast and baby did suck a few times but did not sustain latch. With softer green slow flow nipple baby was able to take 8 ml of formula. Discussed pumping with mother who is agreeable until he latches.  She will breastfeed first, then supplement and pump after.     Maternal Data    Feeding Feeding Type: Bottle Fed - Formula Length of feed: 0 min  LATCH Score Latch: Repeated attempts needed to sustain latch, nipple held in mouth throughout feeding, stimulation needed to elicit sucking reflex.  Audible Swallowing: None  Type of Nipple: Everted at rest and after stimulation  Comfort (Breast/Nipple): Soft / non-tender  Hold (Positioning): Assistance needed to correctly position infant at breast and maintain latch.  LATCH Score: 6  Interventions Interventions: Breast feeding basics reviewed;Assisted with latch;Skin to skin;Breast compression  Lactation Tools Discussed/Used     Consult Status Consult Status: Follow-up Date: 06/18/17 Follow-up type: In-patient    Dahlia Byes [facility] 06/17/2017, 12:59 PM

## 2017-06-17 NOTE — Lactation Note (Signed)
This note was copied from a baby's chart. Lactation Consultation Note  Patient Name: Connie Medina WUJWJ'X Date: 06/17/2017 Reason for consult: Follow-up assessment;1st time breastfeeding;Primapara;Term  RN called again for [facility] assistance. Mom had baby STS when entering the room, baby had already fallen asleep. Mom wanted to try something that her friend told her about, formula supplementation while feeding at the breast. Explained mom her options, she first requested to do it with a curved tip syringe, but LC recommended a 64F french feeding tube as a better option to supplement at the breast.  Set mom up with a 64F french feeding tube, reviewed instructions, cleaning and storage. Mom verbalized understanding. Asked to call for assistance when she's ready to use it since she wished not to wake baby up at this point.   Maternal Data Has patient been taught Hand Expression?: Yes Does the patient have breastfeeding experience prior to this delivery?: No  Feeding Feeding Type: Formula  LATCH Score Latch: (instructed to call at next breastfeed for latch assessment)  Interventions Interventions: Breast feeding basics reviewed  Lactation Tools Discussed/Used     Consult Status Consult Status: Follow-up Date: 06/17/17 Follow-up type: In-patient    Kaven Cumbie Venetia Constable 06/17/2017, 12:39 AM

## 2017-06-18 MED ORDER — IBUPROFEN 600 MG PO TABS: 600.0000 mg | ORAL_TABLET | Freq: Four times a day (QID) | ORAL | 0 refills | Status: DC

## 2017-06-18 MED ORDER — FERROUS SULFATE 325 (65 FE) MG PO TABS: 325.0000 mg | ORAL_TABLET | Freq: Every day | ORAL | 1 refills | Status: DC

## 2017-06-18 NOTE — Lactation Note (Signed)
This note was copied from a baby's chart. Lactation Consultation Note  Patient Name: Connie Medina [id number]'U Date: 06/18/2017 Reason for consult: Follow-up assessment   Per mother baby is now latching better but only for 5 min.  Encouraged longer feedings, unwrapping to wake. Suggest mother post pump q 3 hours and give baby back volume pumped at next feeding. She is also supplementing with formula and will do so until her breastmilk transitions. Mom encouraged to feed baby 8-12 times/24 hours and with feeding cues.  Reviewed engorgement care and monitoring voids/stools. Mother plans to use manual pump until her DEBP arrives.  Suggest she rent and she prefers to use manual.   Maternal Data    Feeding Feeding Type: Formula Nipple Type: Slow - flow  LATCH Score                   Interventions    Lactation Tools Discussed/Used     Consult Status Consult Status: Complete    Hardie Pulley 06/18/2017, 10:54 AM

## 2017-06-18 NOTE — Discharge Summary (Signed)
OB Discharge Summary     Patient Name: Connie Medina DOB: [birth year] MRN: [id number]  Date of admission: 06/13/2017 Delivering MD: Hoover Browns   Date of discharge: 06/18/2017  Admitting diagnosis: OUTBREAK Intrauterine pregnancy: [redacted]w[redacted]d     Secondary diagnosis:  Active Problems:   Oligohydramnios antepartum, third trimester, fetus 1   Hx of fetal biophysical profile   Post-dates pregnancy   Vaginal delivery  Additional problems: none     Discharge diagnosis: Term Pregnancy Delivered and oligohydramnios                                                                                                Post partum procedures:n/a  Augmentation: AROM, Pitocin and Cytotec  Complications: ROM>24 hours  Hospital course:  Induction of Labor With Vaginal Delivery   27 y.o. yo G1P1001 at [redacted]w[redacted]d was admitted to the hospital 06/13/2017 for induction of labor.  Indication for induction: Oligohydramnios, abnormal BPP.  Patient had an uncomplicated labor course as follows: Membrane Rupture Time/Date: 2:30 AM ,06/14/2017   Intrapartum Procedures: Episiotomy: None [1]                                         Lacerations:  Labial [10]  Patient had delivery of a Viable infant.  Information for the patient's newborn:  Malala, Trenkamp [[id number]]  Delivery Method: Vaginal, Vacuum (Extractor)(Filed from Delivery Summary)   06/16/2017  Details of delivery can be found in separate delivery note.  Patient had a routine postpartum course. Patient is discharged home 06/18/17.  Physical exam  Vitals:   06/16/17 1749 06/17/17 0640 06/17/17 1900 06/18/17 0641  BP: 119/79 106/66 121/85 101/65  Pulse: 73 (!) 59 66 72  Resp: Temp:  97.7 F (36.5 C) 97.7 F (36.5 C) 98.9 F (37.2 C)  TempSrc:  Oral Oral   SpO2:  99% 98% 100%  Weight:      Height:       General: alert, cooperative and no distress  CV: RRR Lungs: CTAB Lochia: appropriate Uterine Fundus: firm Incision: N/A DVT  Evaluation: No evidence of DVT seen on physical exam. Labs: Lab Results  Component Value Date   WBC 12.2 (H) 06/17/2017   HGB 8.5 (L) 06/17/2017   HCT 26.4 (L) 06/17/2017   MCV 88.6 06/17/2017   PLT 170 06/17/2017   No flowsheet data found.  Discharge instruction: per After Visit Summary and "Baby and Me Booklet".  After visit meds:  Allergies as of 06/18/2017   No Known Allergies     Medication List    TAKE these medications   ferrous sulfate 325 (65 FE) MG tablet Take 1 tablet (325 mg total) by mouth daily with breakfast.   ibuprofen 600 MG tablet Commonly known as:  ADVIL,MOTRIN Take 1 tablet (600 mg total) by mouth every 6 (six) hours.   prenatal multivitamin Tabs tablet Take 1 tablet by mouth at bedtime.       Diet: routine diet  Activity: Advance  as tolerated. Pelvic rest for 6 weeks.   Outpatient follow up:6 weeks Follow up Appt:No future appointments. Follow up Visit:No follow-ups on file.  Postpartum contraception: Condoms  Newborn Data: Live born female  Birth Weight: 7 lb 4.8 oz (3311 g) APGAR: 6, 8  Newborn Delivery   Time head delivered:  06/16/2017 01:06:00 Birth date/time:  06/16/2017 01:07:00 Delivery type:  Vaginal, Vacuum (Extractor)     Baby Feeding: Bottle and Breast Disposition:NICU   06/18/2017 Sharon Seller, DO

## 2017-06-19 ENCOUNTER — Inpatient Hospital Stay (HOSPITAL_COMMUNITY): Admission: RE | Admit: 2017-06-19 | Payer: Medicaid Other | Source: Ambulatory Visit

## 2018-07-16 ENCOUNTER — Other Ambulatory Visit (HOSPITAL_COMMUNITY)
Admission: RE | Admit: 2018-07-16 | Discharge: 2018-07-16 | Disposition: A | Discharge status: Home / Self Care | Payer: Medicaid Other | Source: Ambulatory Visit | Attending: Obstetrics and Gynecology | Admitting: Obstetrics and Gynecology

## 2018-07-16 ENCOUNTER — Other Ambulatory Visit: Payer: Self-pay | Admitting: Obstetrics and Gynecology

## 2018-07-16 DIAGNOSIS — Z3401 Encounter for supervision of normal first pregnancy, first trimester: Secondary | ICD-10-CM | POA: Diagnosis present

## 2018-07-16 LAB — OB RESULTS CONSOLE HIV ANTIBODY (ROUTINE TESTING): HIV: NONREACTIVE

## 2018-07-16 LAB — OB RESULTS CONSOLE GC/CHLAMYDIA: Gonorrhea: NEGATIVE

## 2018-07-16 LAB — OB RESULTS CONSOLE HEPATITIS B SURFACE ANTIGEN: Hepatitis B Surface Ag: NEGATIVE

## 2018-07-18 LAB — CYTOLOGY - PAP
Chlamydia: NEGATIVE
Diagnosis: NEGATIVE
Neisseria Gonorrhea: NEGATIVE

## 2018-08-20 ENCOUNTER — Other Ambulatory Visit: Payer: Self-pay | Admitting: General Practice

## 2018-08-20 DIAGNOSIS — Z20822 Contact with and (suspected) exposure to covid-19: Secondary | ICD-10-CM

## 2018-08-23 LAB — NOVEL CORONAVIRUS, NAA: SARS-CoV-2, NAA: NOT DETECTED

## 2019-02-13 ENCOUNTER — Encounter (HOSPITAL_COMMUNITY)
Admission: AD | Disposition: A | Discharge status: Home / Self Care | Payer: Self-pay | Source: Home / Self Care | Attending: Obstetrics and Gynecology

## 2019-02-13 ENCOUNTER — Inpatient Hospital Stay (HOSPITAL_COMMUNITY)
Admission: AD | Admit: 2019-02-13 | Discharge: 2019-02-16 | DRG: 787 | Disposition: A | Discharge status: Home / Self Care | Payer: Medicaid Other | Attending: Obstetrics and Gynecology | Admitting: Obstetrics and Gynecology

## 2019-02-13 ENCOUNTER — Inpatient Hospital Stay (HOSPITAL_COMMUNITY): Payer: Medicaid Other | Admitting: Anesthesiology

## 2019-02-13 ENCOUNTER — Other Ambulatory Visit: Payer: Self-pay

## 2019-02-13 ENCOUNTER — Encounter (HOSPITAL_COMMUNITY): Payer: Self-pay | Admitting: Obstetrics and Gynecology

## 2019-02-13 DIAGNOSIS — Z362 Encounter for other antenatal screening follow-up: Secondary | ICD-10-CM | POA: Diagnosis not present

## 2019-02-13 DIAGNOSIS — O26893 Other specified pregnancy related conditions, third trimester: Secondary | ICD-10-CM | POA: Diagnosis present

## 2019-02-13 DIAGNOSIS — Z20822 Contact with and (suspected) exposure to covid-19: Secondary | ICD-10-CM | POA: Diagnosis present

## 2019-02-13 DIAGNOSIS — Z3A40 40 weeks gestation of pregnancy: Secondary | ICD-10-CM

## 2019-02-13 LAB — CBC
HCT: 31.4 % — ABNORMAL LOW (ref 36.0–46.0)
Hemoglobin: 9.6 g/dL — ABNORMAL LOW (ref 12.0–15.0)
MCH: 25.1 pg — ABNORMAL LOW (ref 26.0–34.0)
MCHC: 30.6 g/dL (ref 30.0–36.0)
MCV: 82 fL (ref 80.0–100.0)
Platelets: 265 10*3/uL (ref 150–400)
RBC: 3.83 MIL/uL — ABNORMAL LOW (ref 3.87–5.11)
RDW: 15.3 % (ref 11.5–15.5)
WBC: 9.1 10*3/uL (ref 4.0–10.5)
nRBC: 0 % (ref 0.0–0.2)

## 2019-02-13 LAB — TYPE AND SCREEN
ABO/RH(D): O POS
Antibody Screen: NEGATIVE

## 2019-02-13 LAB — RESPIRATORY PANEL BY RT PCR (FLU A&B, COVID)
Influenza A by PCR: NEGATIVE
Influenza B by PCR: NEGATIVE
SARS Coronavirus 2 by RT PCR: NEGATIVE

## 2019-02-13 LAB — RPR: RPR Ser Ql: NONREACTIVE

## 2019-02-13 LAB — ABO/RH: ABO/RH(D): O POS

## 2019-02-13 SURGERY — Surgical Case
Anesthesia: Epidural

## 2019-02-13 MED ORDER — LACTATED RINGERS IV SOLN
500.0000 mL | Freq: Once | INTRAVENOUS | Status: AC
  Administered 2019-02-13: 500 mL via INTRAVENOUS

## 2019-02-13 MED ORDER — EPHEDRINE 5 MG/ML INJ: 10.0000 mg | INTRAVENOUS | Status: DC | PRN

## 2019-02-13 MED ORDER — OXYTOCIN 40 UNITS IN NORMAL SALINE INFUSION - SIMPLE MED
1.0000 m[IU]/min | INTRAVENOUS | Status: DC
  Administered 2019-02-13: 2 m[IU]/min via INTRAVENOUS

## 2019-02-13 MED ORDER — TERBUTALINE SULFATE 1 MG/ML IJ SOLN: 0.2500 mg | Freq: Once | INTRAMUSCULAR | Status: DC | PRN

## 2019-02-13 MED ORDER — FENTANYL CITRATE (PF) 100 MCG/2ML IJ SOLN
INTRAMUSCULAR | Status: AC
  Filled 2019-02-13: qty 2

## 2019-02-13 MED ORDER — LACTATED RINGERS IV SOLN: INTRAVENOUS | Status: DC

## 2019-02-13 MED ORDER — FENTANYL-BUPIVACAINE-NACL 0.5-0.125-0.9 MG/250ML-% EP SOLN
12.0000 mL/h | EPIDURAL | Status: DC | PRN
  Administered 2019-02-13: 12 mL/h via EPIDURAL
  Filled 2019-02-13 (×2): qty 250

## 2019-02-13 MED ORDER — OXYTOCIN 40 UNITS IN NORMAL SALINE INFUSION - SIMPLE MED
2.5000 [IU]/h | INTRAVENOUS | Status: DC
  Filled 2019-02-13: qty 1000

## 2019-02-13 MED ORDER — OXYCODONE-ACETAMINOPHEN 5-325 MG PO TABS: 2.0000 | ORAL_TABLET | ORAL | Status: DC | PRN

## 2019-02-13 MED ORDER — SODIUM CHLORIDE (PF) 0.9 % IJ SOLN
INTRAMUSCULAR | Status: DC | PRN
  Administered 2019-02-13: 12 mL/h via EPIDURAL

## 2019-02-13 MED ORDER — LIDOCAINE HCL (PF) 1 % IJ SOLN
INTRAMUSCULAR | Status: DC | PRN
  Administered 2019-02-13: 10 mL via EPIDURAL

## 2019-02-13 MED ORDER — OXYCODONE-ACETAMINOPHEN 5-325 MG PO TABS: 1.0000 | ORAL_TABLET | ORAL | Status: DC | PRN

## 2019-02-13 MED ORDER — FLEET ENEMA 7-19 GM/118ML RE ENEM: 1.0000 | ENEMA | RECTAL | Status: DC | PRN

## 2019-02-13 MED ORDER — SOD CITRATE-CITRIC ACID 500-334 MG/5ML PO SOLN
30.0000 mL | ORAL | Status: DC | PRN
  Administered 2019-02-14: 30 mL via ORAL
  Filled 2019-02-13: qty 30

## 2019-02-13 MED ORDER — LACTATED RINGERS IV SOLN
500.0000 mL | INTRAVENOUS | Status: DC | PRN
  Administered 2019-02-13: 500 mL via INTRAVENOUS

## 2019-02-13 MED ORDER — ONDANSETRON HCL 4 MG/2ML IJ SOLN: 4.0000 mg | Freq: Four times a day (QID) | INTRAMUSCULAR | Status: DC | PRN

## 2019-02-13 MED ORDER — DIPHENHYDRAMINE HCL 50 MG/ML IJ SOLN: 12.5000 mg | INTRAMUSCULAR | Status: DC | PRN

## 2019-02-13 MED ORDER — OXYTOCIN BOLUS FROM INFUSION: 500.0000 mL | Freq: Once | INTRAVENOUS | Status: DC

## 2019-02-13 MED ORDER — LIDOCAINE HCL (PF) 1 % IJ SOLN: 30.0000 mL | INTRAMUSCULAR | Status: DC | PRN

## 2019-02-13 MED ORDER — PHENYLEPHRINE 40 MCG/ML (10ML) SYRINGE FOR IV PUSH (FOR BLOOD PRESSURE SUPPORT): 80.0000 ug | PREFILLED_SYRINGE | INTRAVENOUS | Status: DC | PRN

## 2019-02-13 MED ORDER — FENTANYL CITRATE (PF) 100 MCG/2ML IJ SOLN
50.0000 ug | INTRAMUSCULAR | Status: DC | PRN
  Administered 2019-02-13: 50 ug via INTRAVENOUS

## 2019-02-13 MED ORDER — ACETAMINOPHEN 325 MG PO TABS
650.0000 mg | ORAL_TABLET | ORAL | Status: DC | PRN
  Administered 2019-02-13: 650 mg via ORAL
  Filled 2019-02-13: qty 2

## 2019-02-13 SURGICAL SUPPLY — 39 items
BENZOIN TINCTURE PRP APPL 2/3 (GAUZE/BANDAGES/DRESSINGS) ×3 IMPLANT
CLAMP CORD UMBIL (MISCELLANEOUS) IMPLANT
CLOSURE STERI-STRIP 1/4X4 (GAUZE/BANDAGES/DRESSINGS) ×3 IMPLANT
CLOSURE WOUND 1/2 X4 (GAUZE/BANDAGES/DRESSINGS) ×1
CLOTH BEACON ORANGE TIMEOUT ST (SAFETY) ×3 IMPLANT
DERMABOND ADVANCED (GAUZE/BANDAGES/DRESSINGS)
DERMABOND ADVANCED .7 DNX12 (GAUZE/BANDAGES/DRESSINGS) IMPLANT
DRSG OPSITE POSTOP 4X10 (GAUZE/BANDAGES/DRESSINGS) ×3 IMPLANT
ELECT REM PT RETURN 9FT ADLT (ELECTROSURGICAL) ×3
ELECTRODE REM PT RTRN 9FT ADLT (ELECTROSURGICAL) ×1 IMPLANT
EXTRACTOR VACUUM KIWI (MISCELLANEOUS) IMPLANT
GLOVE BIOGEL PI IND STRL 7.0 (GLOVE) ×3 IMPLANT
GLOVE BIOGEL PI INDICATOR 7.0 (GLOVE) ×6
GLOVE ECLIPSE 6.5 STRL STRAW (GLOVE) ×3 IMPLANT
GOWN STRL REUS W/TWL LRG LVL3 (GOWN DISPOSABLE) ×9 IMPLANT
HEMOSTAT ARISTA ABSORB 3G PWDR (HEMOSTASIS) ×3 IMPLANT
KIT ABG SYR 3ML LUER SLIP (SYRINGE) IMPLANT
NEEDLE HYPO 25X5/8 SAFETYGLIDE (NEEDLE) IMPLANT
NS IRRIG 1000ML POUR BTL (IV SOLUTION) ×3 IMPLANT
PACK C SECTION WH (CUSTOM PROCEDURE TRAY) ×3 IMPLANT
PAD ABD 7.5X8 STRL (GAUZE/BANDAGES/DRESSINGS) ×3 IMPLANT
PAD ABD DERMACEA PRESS 5X9 (GAUZE/BANDAGES/DRESSINGS) ×3 IMPLANT
PAD OB MATERNITY 4.3X12.25 (PERSONAL CARE ITEMS) ×3 IMPLANT
PENCIL SMOKE EVAC W/HOLSTER (ELECTROSURGICAL) ×3 IMPLANT
RTRCTR C-SECT PINK 25CM LRG (MISCELLANEOUS) ×3 IMPLANT
SPONGE LAP 18X18 RF (DISPOSABLE) ×9 IMPLANT
STRIP CLOSURE SKIN 1/2X4 (GAUZE/BANDAGES/DRESSINGS) ×2 IMPLANT
SUT PLAIN 0 NONE (SUTURE) IMPLANT
SUT PLAIN 2 0 XLH (SUTURE) IMPLANT
SUT VIC AB 0 CT1 27 (SUTURE) ×4
SUT VIC AB 0 CT1 27XBRD ANBCTR (SUTURE) ×2 IMPLANT
SUT VIC AB 0 CTX 36 (SUTURE) ×6
SUT VIC AB 0 CTX36XBRD ANBCTRL (SUTURE) ×3 IMPLANT
SUT VIC AB 2-0 CT1 27 (SUTURE) ×2
SUT VIC AB 2-0 CT1 TAPERPNT 27 (SUTURE) ×1 IMPLANT
SUT VIC AB 4-0 KS 27 (SUTURE) ×3 IMPLANT
TOWEL OR 17X24 6PK STRL BLUE (TOWEL DISPOSABLE) ×3 IMPLANT
TRAY FOLEY W/BAG SLVR 14FR LF (SET/KITS/TRAYS/PACK) IMPLANT
WATER STERILE IRR 1000ML POUR (IV SOLUTION) ×3 IMPLANT

## 2019-02-13 NOTE — Anesthesia Preprocedure Evaluation (Addendum)
Anesthesia Evaluation  Patient identified by MRN, date of birth, ID band Patient awake    Reviewed: Allergy & Precautions, H&P , NPO status , Patient's Chart, lab work & pertinent test results  History of Anesthesia Complications Negative for: history of anesthetic complications  Airway Mallampati: II  TM Distance: >3 FB Neck ROM: full    Dental no notable dental hx.    Pulmonary neg pulmonary ROS,    Pulmonary exam normal        Cardiovascular negative cardio ROS Normal cardiovascular exam Rhythm:regular Rate:Normal     Neuro/Psych negative neurological ROS  negative psych ROS   GI/Hepatic negative GI ROS, Neg liver ROS,   Endo/Other  negative endocrine ROS  Renal/GU negative Renal ROS  negative genitourinary   Musculoskeletal   Abdominal   Peds  Hematology negative hematology ROS (+)   Anesthesia Other Findings   Reproductive/Obstetrics (+) Pregnancy                             Anesthesia Physical Anesthesia Plan  ASA: II and emergent  Anesthesia Plan: Epidural   Post-op Pain Management:    Induction:   PONV Risk Score and Plan:   Airway Management Planned:   Additional Equipment:   Intra-op Plan:   Post-operative Plan:   Informed Consent: I have reviewed the patients History and Physical, chart, labs and discussed the procedure including the risks, benefits and alternatives for the proposed anesthesia with the patient or authorized representative who has indicated his/her understanding and acceptance.       Plan Discussed with:   Anesthesia Plan Comments: (To OR for C section due to fetal intolerance. Labor epidural in place and functioning well. )       Anesthesia Quick Evaluation

## 2019-02-13 NOTE — Anesthesia Procedure Notes (Signed)
Epidural Patient location during procedure: OB Start time: 02/13/2019 2:27 AM End time: 02/13/2019 2:40 AM  Staffing Anesthesiologist: [name], MD Performed: anesthesiologist   Preanesthetic Checklist Completed: patient identified, IV checked, risks and benefits discussed, monitors and equipment checked, pre-op evaluation and timeout performed  Epidural Patient position: sitting Prep: DuraPrep Patient monitoring: heart rate, continuous pulse ox and blood pressure Approach: midline Location: L3-L4 Injection technique: LOR air  Needle:  Needle type: Tuohy  Needle gauge: 17 G Needle length: 9 cm Needle insertion depth: 7 cm Catheter type: closed end flexible Catheter size: 19 Gauge Catheter at skin depth: 12 cm Test dose: negative  Assessment Events: blood not aspirated, injection not painful, no injection resistance, no paresthesia and negative IV test  Additional Notes Reason for block:procedure for pain

## 2019-02-13 NOTE — Progress Notes (Signed)
Subjective:    Comfortable w/ epidural, requesting to sleep a bit more before birth   Objective:    VS: BP 100/65   Pulse (!) 101   Temp 98.2 F (36.8 C) (Oral)   Resp 17   Ht 5\' 3"  (1.6 m)   Wt 95.2 kg   SpO2 100%   BMI 37.16 kg/m  FHR : baseline 135 / variability moderate / accelerations present / absent decelerations Toco: contractions every 2-4 minutes  Membranes: Bulging Dilation: 9 Effacement (%): 100 Cervical Position: Middle Station: 0 Presentation: Vertex Exam by:: [email or web address], CNM  Assessment/Plan:   29 y.o. G3P1001 [redacted]w[redacted]d  Labor: Progressing normally Fetal Wellbeing:  Category I Pain Control:  Epidural I/D:  GBS negative Anticipated MOD:  NSVD   Will update Dr. [redacted]w[redacted]d during sign-out.   Charlotta Newton CNM, MSN 02/13/2019 6:14 AM

## 2019-02-13 NOTE — Progress Notes (Signed)
  Subjective:  pt reports feeling more pressure...   Objective: BP (!) 117/58   Pulse (!) 109   Temp 98.6 F (37 C) (Oral)   Resp 16   Ht 5\' 3"  (1.6 m)   Wt 95.2 kg   SpO2 100%   BMI 37.16 kg/m  No intake/output data recorded. Total I/O In: -  Out: 1375 [Urine:1375]  FHT:  FHR: 140 bpm, variability: moderate,  accelerations:  Present,  decelerations:  Absent UC:   irregular, every 2-4 minutes SVE:   Dilation: 9 Effacement (%): 100 Station: 0 Exam by:: [email or web address] MD  Labs: Lab Results  Component Value Date   WBC 9.1 02/13/2019   HGB 9.6 (L) 02/13/2019   HCT 31.4 (L) 02/13/2019   MCV 82.0 02/13/2019   PLT 265 02/13/2019    Assessment / Plan: Augmentation of labor, progressing well  Labor: continue pitocin to reach mvu of at least 200 Preeclampsia:  NA Fetal Wellbeing:  Category I Pain Control:  Epidural I/D:  n/a Anticipated MOD:  NSVD  Report given to Dr. 02/15/2019 covering from 5 pm until 7 am   Myna Hidalgo 02/13/2019, 5:11 PM

## 2019-02-13 NOTE — Progress Notes (Signed)
  Subjective: Pt resting comfortable, feeling some pressure in her back  Objective: BP 130/85   Pulse (!) 107   Temp 100.1 F (37.8 C) (Oral)   Resp 16   Ht 5\' 3"  (1.6 m)   Wt 95.2 kg   SpO2 100%   BMI 37.16 kg/m  I/O last 3 completed shifts: In: -  Out: 1375 [Urine:1375] No intake/output data recorded.  FHT:  FHR: 140 bpm, variability: moderate,  accelerations:  Present, initially no decelerations noted, when pushing late decels noted to 120bpm for ~ 10-20sec UC:   q2-34min SVE:   Dilation: 10 Effacement (%): 100 Station: 0 to +1 Exam by:: [email or web address], DO  Labs: Lab Results  Component Value Date   WBC 9.1 02/13/2019   HGB 9.6 (L) 02/13/2019   HCT 31.4 (L) 02/13/2019   MCV 82.0 02/13/2019   PLT 265 02/13/2019    Assessment / Plan: 28yo G2P1001 @ 41w1 for active labor  Labor: adequate contractions noted.  IUPC in placed.  Discussed management- she is very concerned about pushing for a prolonged period.  Reviewed management options- discussed risk/benefit of vaginal delivery vs C-section .  Pt agreeable to push for at least 1hr to see if further change is noted.   Preeclampsia:  NA Fetal Wellbeing:  Category I- with pushing Cat. II tracing Pain Control:  Epidural    02/15/2019, DO [phone] (cell) [phone] (office)

## 2019-02-13 NOTE — Plan of Care (Signed)

## 2019-02-13 NOTE — H&P (Signed)
OB ADMISSION/ HISTORY & PHYSICAL:  Admission Date: 02/13/2019 12:52 AM  Admit Diagnosis: Active labor  Connie Medina is a 29 y.o. female G3P1001 104w1d presenting for ctx. Endorses active FM, denies LOF and vaginal bleeding. Ctx began @ 2100 on 02/12/19  History of current pregnancy: G3P1001   Primary Ob Provider: Dr. Landry Mellow Patient entered care with [facility] at 9+6 wks.   EDC of 02/12/19 was established by LMP  Anatomy scan:  17+6 wks, with normal findings     Patient Active Problem List   Diagnosis Date Noted  . Normal labor 02/13/2019    Prenatal Labs: ABO, Rh: --/--/O POS, O POS Performed at [facility], [place] [address] [place], [place] [address]  [id number]/13 0130) Antibody: NEG (01/13 0130) Rubella:   Immune RPR:   non-reactive HBsAg:   negative HIV:   non-reactive GBS:   negative GC/CHL: negative, negative  OB History  Gravida Para Term Preterm AB Living  3 1 1  0 0 1  SAB TAB Ectopic Multiple Live Births  0 0 0 0 1    # Outcome Date GA Lbr Len/2nd Weight Sex Delivery Anes PTL Lv  3 Current           2 Term 06/16/17 [redacted]w[redacted]d / 03:43 3311 g M Vag-Vacuum EPI  LIV  1 Gravida             Medical / Surgical History: Past medical history:  Past Medical History:  Diagnosis Date  . Infection    UTI  . Migraines     Past surgical history:  Past Surgical History:  Procedure Laterality Date  . TONSILLECTOMY AND ADENOIDECTOMY Bilateral 09/30/2014   Procedure: BILATERAL TONSILLECTOMY AND ADENOIDECTOMY;  Surgeon: Leta Baptist, MD;  Location: [facility];  Service: ENT;  Laterality: Bilateral;   Family History:  Family History  Problem Relation Age of Onset  . Kidney disease Mother   . Anemia Mother   . Depression Father   . Mental illness Father   . Hypertension Father   . Hyperlipidemia Father   . Asthma Brother   . Cancer Paternal Uncle        colon  . Diabetes Maternal Grandmother   . Kidney disease Maternal Grandmother     Social History:  reports  that she has never smoked. She has never used smokeless tobacco. She reports that she does not drink alcohol or use drugs.  Allergies: Patient has no known allergies.   Current Medications at time of admission:  Prior to Admission medications   Medication Sig Start Date End Date Taking? Authorizing Provider  ferrous sulfate 325 (65 FE) MG tablet Take 1 tablet (325 mg total) by mouth daily with breakfast. 06/18/17   Janyth Pupa, DO  ibuprofen (ADVIL,MOTRIN) 600 MG tablet Take 1 tablet (600 mg total) by mouth every 6 (six) hours. 06/18/17   Janyth Pupa, DO  Prenatal Vit-Fe Fumarate-FA (PRENATAL MULTIVITAMIN) TABS tablet Take 1 tablet by mouth at bedtime.    [provider]    Review of Systems: Constitutional: Negative   HENT: Negative   Eyes: Negative   Respiratory: Negative   Cardiovascular: Negative   Gastrointestinal: Negative  Genitourinary: negative for bloody show, negative for LOF   Musculoskeletal: Negative   Skin: Negative   Neurological: Negative   Endo/Heme/Allergies: Negative   Psychiatric/Behavioral: Negative    Physical Exam: VS: Blood pressure 129/71, pulse 89, temperature 98.2 F (36.8 C), resp. rate 18, height 5\' 3"  (1.6 m), weight  95.2 kg, unknown if currently breastfeeding. AAO x3, no signs of distress Cardiovascular: RRR Respiratory: Lung fields clear to ausculation GU/GI: Abdomen gravid, non-tender, non-distended, active FM, vertex, EFW 7# per Leopold's Extremities: no edema, negative for pain, tenderness, and cords  Cervical exam:Dilation: 6 Effacement (%): 100 Station: -1 Exam by:: benji stanley RN FHR: baseline rate 130 / variability moderate / accelerations present / absent decelerations TOCO: 2-3 min   Prenatal Transfer Tool  Maternal Diabetes: No Genetic Screening: Declined Maternal Ultrasounds/Referrals: Normal Fetal Ultrasounds or other Referrals:  None Maternal Substance Abuse:  No Significant Maternal Medications:   None Significant Maternal Lab Results: Group B Strep negative    Assessment: 29 y.o. G3P1001 [redacted]w[redacted]d  Active stage of labor FHR category 1 GBS negative Pain management plan: epidural   Plan:  Admit to L&D Routine admission orders Epidural PRN  Dra Marjie Skiff to be notified of admission and plan of care at sign-out  Roma Schanz CNM, MSN 02/13/2019 1:37 AM

## 2019-02-13 NOTE — Progress Notes (Signed)
Subjective:  she is comfortable with her epidural she denies feeling pressure .. still leaking fluid   Objective: BP 113/70   Pulse (!) 114   Temp 99 F (37.2 C) (Oral)   Resp 16   Ht 5\' 3"  (1.6 m)   Wt 95.2 kg   SpO2 100%   BMI 37.16 kg/m  No intake/output data recorded. Total I/O In: -  Out: 1375 [Urine:1375]  FHT:  FHR: 135 bpm, variability: moderate,  accelerations:  Present,  decelerations:  Present variable UC:   regular, every 2-3 minutes SVE:   Dilation: 8 Effacement (%): 100 Station: 0 Exam by:: Dr [email or web address].. IUPC placed   Labs: Lab Results  Component Value Date   WBC 9.1 02/13/2019   HGB 9.6 (L) 02/13/2019   HCT 31.4 (L) 02/13/2019   MCV 82.0 02/13/2019   PLT 265 02/13/2019    Assessment / Plan: Augmentation of labor, progressing well .. protracted active phase   Labor: protracted active phase ... increase pitocin to reach mvu of 200  Preeclampsia:  na Fetal Wellbeing:  Category I and Category II Pain Control:  Epidural I/D:  n/a Anticipated MOD:  NSVD  02/15/2019 02/13/2019, 2:09 PM

## 2019-02-13 NOTE — MAU Note (Signed)
PT SAYS  THINKS [facility] AT [facility]   . WAS AT DR OFFICE- CHECKED FOR SROM- NEG.    VE  2-3 CM .  DENIES HSV AND MRSA. GBS-  NEG

## 2019-02-13 NOTE — MAU Provider Note (Signed)
Pt informed that the ultrasound is considered a limited OB ultrasound and is not intended to be a complete ultrasound exam.  Patient also informed that the ultrasound is not being completed with the intent of assessing for fetal or placental anomalies or any pelvic abnormalities.  Explained that the purpose of today's ultrasound is to assess for presentation.  Patient acknowledges the purpose of the exam and the limitations of the study.    Fetus is presenting vertex Longitudinal lie

## 2019-02-13 NOTE — Progress Notes (Signed)
Connie Medina is a 29 y.o. G3P1001 at [redacted]w[redacted]d by LMP admitted for active labor  Subjective: Patient is comfortable with her epidural   Objective: BP 107/60   Pulse (!) 106   Temp 98.2 F (36.8 C) (Oral)   Resp 18   Ht 5\' 3"  (1.6 m)   Wt 95.2 kg   SpO2 100%   BMI 37.16 kg/m  No intake/output data recorded. Total I/O In: -  Out: 1375 [Urine:1375]  FHT:  FHR: 130 bpm, variability: moderate,  accelerations:  Present,  decelerations:  Absent UC:   irregular, every 2-5 minutes SVE:   Dilation: 8 Effacement (%): 100 Station: -2 Exam by:: [email or web address]... AROM thin meconium   Labs: Lab Results  Component Value Date   WBC 9.1 02/13/2019   HGB 9.6 (L) 02/13/2019   HCT 31.4 (L) 02/13/2019   MCV 82.0 02/13/2019   PLT 265 02/13/2019    Assessment / Plan: Spontaneous labor, progressing normally  Labor: Progressing normally Preeclampsia:  NA Fetal Wellbeing:  Category I Pain Control:  Epidural I/D:  n/a Anticipated MOD:  NSVD  02/15/2019 02/13/2019, 7:33 AM

## 2019-02-14 ENCOUNTER — Encounter (HOSPITAL_COMMUNITY): Payer: Self-pay | Admitting: Obstetrics and Gynecology

## 2019-02-14 LAB — CBC
HCT: 26.1 % — ABNORMAL LOW (ref 36.0–46.0)
Hemoglobin: 8.1 g/dL — ABNORMAL LOW (ref 12.0–15.0)
MCH: 25.5 pg — ABNORMAL LOW (ref 26.0–34.0)
MCHC: 31 g/dL (ref 30.0–36.0)
MCV: 82.1 fL (ref 80.0–100.0)
Platelets: 234 10*3/uL (ref 150–400)
RBC: 3.18 MIL/uL — ABNORMAL LOW (ref 3.87–5.11)
RDW: 15.6 % — ABNORMAL HIGH (ref 11.5–15.5)
WBC: 19 10*3/uL — ABNORMAL HIGH (ref 4.0–10.5)
nRBC: 0 % (ref 0.0–0.2)

## 2019-02-14 MED ORDER — SODIUM BICARBONATE 8.4 % IV SOLN
INTRAVENOUS | Status: DC | PRN
  Administered 2019-02-14: 10 mL via EPIDURAL
  Administered 2019-02-14: 3 mL via EPIDURAL

## 2019-02-14 MED ORDER — WITCH HAZEL-GLYCERIN EX PADS: 1.0000 "application " | MEDICATED_PAD | CUTANEOUS | Status: DC | PRN

## 2019-02-14 MED ORDER — NALBUPHINE HCL 10 MG/ML IJ SOLN: 5.0000 mg | INTRAMUSCULAR | Status: DC | PRN

## 2019-02-14 MED ORDER — KETOROLAC TROMETHAMINE 30 MG/ML IJ SOLN
30.0000 mg | Freq: Once | INTRAMUSCULAR | Status: AC | PRN
  Administered 2019-02-14: 30 mg via INTRAVENOUS

## 2019-02-14 MED ORDER — ONDANSETRON HCL 4 MG/2ML IJ SOLN: 4.0000 mg | Freq: Once | INTRAMUSCULAR | Status: DC | PRN

## 2019-02-14 MED ORDER — MEPERIDINE HCL 25 MG/ML IJ SOLN: 6.2500 mg | INTRAMUSCULAR | Status: DC | PRN

## 2019-02-14 MED ORDER — ZOLPIDEM TARTRATE 5 MG PO TABS: 5.0000 mg | ORAL_TABLET | Freq: Every evening | ORAL | Status: DC | PRN

## 2019-02-14 MED ORDER — OXYTOCIN 40 UNITS IN NORMAL SALINE INFUSION - SIMPLE MED: 2.5000 [IU]/h | INTRAVENOUS | Status: AC

## 2019-02-14 MED ORDER — OXYCODONE HCL 5 MG PO TABS: 5.0000 mg | ORAL_TABLET | Freq: Once | ORAL | Status: DC | PRN

## 2019-02-14 MED ORDER — SCOPOLAMINE 1 MG/3DAYS TD PT72: 1.0000 | MEDICATED_PATCH | Freq: Once | TRANSDERMAL | Status: DC

## 2019-02-14 MED ORDER — SENNOSIDES-DOCUSATE SODIUM 8.6-50 MG PO TABS
2.0000 | ORAL_TABLET | ORAL | Status: DC
  Administered 2019-02-14 – 2019-02-15 (×2): 2 via ORAL
  Filled 2019-02-14 (×2): qty 2

## 2019-02-14 MED ORDER — ONDANSETRON HCL 4 MG/2ML IJ SOLN
INTRAMUSCULAR | Status: DC | PRN
  Administered 2019-02-14: 4 mg via INTRAVENOUS

## 2019-02-14 MED ORDER — SIMETHICONE 80 MG PO CHEW
80.0000 mg | CHEWABLE_TABLET | Freq: Three times a day (TID) | ORAL | Status: DC
  Administered 2019-02-14 – 2019-02-16 (×6): 80 mg via ORAL
  Filled 2019-02-14 (×7): qty 1

## 2019-02-14 MED ORDER — OXYCODONE HCL 5 MG/5ML PO SOLN: 5.0000 mg | Freq: Once | ORAL | Status: DC | PRN

## 2019-02-14 MED ORDER — NALBUPHINE HCL 10 MG/ML IJ SOLN: 5.0000 mg | Freq: Once | INTRAMUSCULAR | Status: DC | PRN

## 2019-02-14 MED ORDER — LACTATED RINGERS IV SOLN: INTRAVENOUS | Status: DC | PRN

## 2019-02-14 MED ORDER — OXYCODONE HCL 5 MG PO TABS: 5.0000 mg | ORAL_TABLET | ORAL | Status: DC | PRN

## 2019-02-14 MED ORDER — DIPHENHYDRAMINE HCL 50 MG/ML IJ SOLN: 12.5000 mg | INTRAMUSCULAR | Status: DC | PRN

## 2019-02-14 MED ORDER — FENTANYL CITRATE (PF) 100 MCG/2ML IJ SOLN: 25.0000 ug | INTRAMUSCULAR | Status: DC | PRN

## 2019-02-14 MED ORDER — SODIUM CHLORIDE 0.9% FLUSH: 3.0000 mL | INTRAVENOUS | Status: DC | PRN

## 2019-02-14 MED ORDER — NALOXONE HCL 4 MG/10ML IJ SOLN
1.0000 ug/kg/h | INTRAVENOUS | Status: DC | PRN
  Filled 2019-02-14: qty 5

## 2019-02-14 MED ORDER — ONDANSETRON HCL 4 MG/2ML IJ SOLN: 4.0000 mg | Freq: Three times a day (TID) | INTRAMUSCULAR | Status: DC | PRN

## 2019-02-14 MED ORDER — PRENATAL MULTIVITAMIN CH
1.0000 | ORAL_TABLET | Freq: Every day | ORAL | Status: DC
  Administered 2019-02-14 – 2019-02-16 (×3): 1 via ORAL
  Filled 2019-02-14 (×3): qty 1

## 2019-02-14 MED ORDER — COCONUT OIL OIL
1.0000 "application " | TOPICAL_OIL | Status: DC | PRN
  Administered 2019-02-16: 1 via TOPICAL

## 2019-02-14 MED ORDER — FENTANYL CITRATE (PF) 100 MCG/2ML IJ SOLN
INTRAMUSCULAR | Status: AC
  Filled 2019-02-14: qty 2

## 2019-02-14 MED ORDER — MEPERIDINE HCL 25 MG/ML IJ SOLN
INTRAMUSCULAR | Status: DC | PRN
  Administered 2019-02-14: 12.5 mg via INTRAVENOUS

## 2019-02-14 MED ORDER — OXYTOCIN 40 UNITS IN NORMAL SALINE INFUSION - SIMPLE MED
INTRAVENOUS | Status: DC | PRN
  Administered 2019-02-14: 40 mL via INTRAVENOUS

## 2019-02-14 MED ORDER — SODIUM CHLORIDE 0.9 % IV SOLN: INTRAVENOUS | Status: DC | PRN

## 2019-02-14 MED ORDER — DIPHENHYDRAMINE HCL 25 MG PO CAPS: 25.0000 mg | ORAL_CAPSULE | Freq: Four times a day (QID) | ORAL | Status: DC | PRN

## 2019-02-14 MED ORDER — TETANUS-DIPHTH-ACELL PERTUSSIS 5-2.5-18.5 LF-MCG/0.5 IM SUSP: 0.5000 mL | Freq: Once | INTRAMUSCULAR | Status: DC

## 2019-02-14 MED ORDER — DIPHENHYDRAMINE HCL 25 MG PO CAPS: 25.0000 mg | ORAL_CAPSULE | ORAL | Status: DC | PRN

## 2019-02-14 MED ORDER — CEFAZOLIN SODIUM-DEXTROSE 2-3 GM-%(50ML) IV SOLR
INTRAVENOUS | Status: DC | PRN
  Administered 2019-02-14: 2 g via INTRAVENOUS

## 2019-02-14 MED ORDER — KETOROLAC TROMETHAMINE 30 MG/ML IJ SOLN
INTRAMUSCULAR | Status: AC
  Filled 2019-02-14: qty 1

## 2019-02-14 MED ORDER — SIMETHICONE 80 MG PO CHEW
80.0000 mg | CHEWABLE_TABLET | ORAL | Status: DC
  Administered 2019-02-14 – 2019-02-15 (×2): 80 mg via ORAL
  Filled 2019-02-14 (×2): qty 1

## 2019-02-14 MED ORDER — NALOXONE HCL 0.4 MG/ML IJ SOLN: 0.4000 mg | INTRAMUSCULAR | Status: DC | PRN

## 2019-02-14 MED ORDER — IBUPROFEN 600 MG PO TABS
600.0000 mg | ORAL_TABLET | Freq: Four times a day (QID) | ORAL | Status: DC
  Administered 2019-02-15 – 2019-02-16 (×5): 600 mg via ORAL
  Filled 2019-02-14 (×5): qty 1

## 2019-02-14 MED ORDER — SIMETHICONE 80 MG PO CHEW: 80.0000 mg | CHEWABLE_TABLET | ORAL | Status: DC | PRN

## 2019-02-14 MED ORDER — ACETAMINOPHEN 325 MG PO TABS
650.0000 mg | ORAL_TABLET | ORAL | Status: DC | PRN
  Administered 2019-02-14 – 2019-02-16 (×4): 650 mg via ORAL
  Filled 2019-02-14 (×4): qty 2

## 2019-02-14 MED ORDER — METHYLERGONOVINE MALEATE 0.2 MG/ML IJ SOLN
INTRAMUSCULAR | Status: DC | PRN
  Administered 2019-02-14: .2 mg via INTRAMUSCULAR

## 2019-02-14 MED ORDER — LACTATED RINGERS IV SOLN: INTRAVENOUS | Status: DC

## 2019-02-14 MED ORDER — FENTANYL CITRATE (PF) 100 MCG/2ML IJ SOLN
INTRAMUSCULAR | Status: DC | PRN
  Administered 2019-02-14: 100 ug via EPIDURAL

## 2019-02-14 MED ORDER — MEPERIDINE HCL 25 MG/ML IJ SOLN
INTRAMUSCULAR | Status: AC
  Filled 2019-02-14: qty 1

## 2019-02-14 MED ORDER — DEXAMETHASONE SODIUM PHOSPHATE 10 MG/ML IJ SOLN
INTRAMUSCULAR | Status: DC | PRN
  Administered 2019-02-14: 10 mg via INTRAVENOUS

## 2019-02-14 MED ORDER — DIBUCAINE (PERIANAL) 1 % EX OINT: 1.0000 "application " | TOPICAL_OINTMENT | CUTANEOUS | Status: DC | PRN

## 2019-02-14 MED ORDER — MORPHINE SULFATE (PF) 0.5 MG/ML IJ SOLN
INTRAMUSCULAR | Status: DC | PRN
  Administered 2019-02-14: 2 mg via EPIDURAL
  Administered 2019-02-14: 3 mg via EPIDURAL

## 2019-02-14 MED ORDER — ONDANSETRON HCL 4 MG/2ML IJ SOLN
INTRAMUSCULAR | Status: AC
  Filled 2019-02-14: qty 2

## 2019-02-14 MED ORDER — DEXAMETHASONE SODIUM PHOSPHATE 10 MG/ML IJ SOLN
INTRAMUSCULAR | Status: AC
  Filled 2019-02-14: qty 1

## 2019-02-14 MED ORDER — MORPHINE SULFATE (PF) 0.5 MG/ML IJ SOLN
INTRAMUSCULAR | Status: AC
  Filled 2019-02-14: qty 10

## 2019-02-14 MED ORDER — MENTHOL 3 MG MT LOZG: 1.0000 | LOZENGE | OROMUCOSAL | Status: DC | PRN

## 2019-02-14 MED ORDER — KETOROLAC TROMETHAMINE 30 MG/ML IJ SOLN
30.0000 mg | Freq: Four times a day (QID) | INTRAMUSCULAR | Status: AC
  Administered 2019-02-14 – 2019-02-15 (×4): 30 mg via INTRAVENOUS
  Filled 2019-02-14 (×4): qty 1

## 2019-02-14 NOTE — Lactation Note (Signed)
This note was copied from a baby's chart. Lactation Consultation Note LC attempted to see mom, sleeping soundly.  Patient Name: Connie Medina LLIYI'Y Date: 02/14/2019     Maternal Data    Feeding Feeding Type: Breast Fed  LATCH Score Latch: Repeated attempts needed to sustain latch, nipple held in mouth throughout feeding, stimulation needed to elicit sucking reflex.  Audible Swallowing: Spontaneous and intermittent  Type of Nipple: Everted at rest and after stimulation  Comfort (Breast/Nipple): Soft / non-tender  Hold (Positioning): Assistance needed to correctly position infant at breast and maintain latch.  LATCH Score: 8  Interventions Interventions: Skin to skin;Support pillows;Adjust position  Lactation Tools Discussed/Used     Consult Status      Charyl Dancer 02/14/2019, 6:34 AM

## 2019-02-14 NOTE — Lactation Note (Signed)
This note was copied from a baby's chart. Lactation Consultation Note:  P2, infant is 39 hours old and term. Mother reports that infant has breastfed 3 times.   Mother reports that she breastfed her first son for 10 months. She denies having had any difficulty. Son at home is 90 months old.   Reviewed basic breastfeeding . Mother was given [facility] brochure. Encouraged mother to hand express before and after feeding.   Mother was advised to do frequent STS. Mother to cue base feed infant and fed at least 8-12 or more times. Discussed cluster feeding.  Infant sleeping in crib at bedside. Advised mother to page for staff nurse or LC to observe next feeding.  Mother aware of available LC services and community support.    Patient Name: Connie Medina Date: 02/14/2019 Reason for consult: Initial assessment   Maternal Data Has patient been taught Hand Expression?: Yes Does the patient have breastfeeding experience prior to this delivery?: Yes  Feeding Feeding Type: Breast Fed  LATCH Score                   Interventions Interventions: Breast feeding basics reviewed  Lactation Tools Discussed/Used     Consult Status Consult Status: Follow-up Date: 02/15/19 Follow-up type: In-patient    Stevan Born [facility] 02/14/2019, 11:29 AM

## 2019-02-14 NOTE — Transfer of Care (Signed)
Immediate Anesthesia Transfer of Care Note  Patient: Connie Medina  Procedure(s) Performed: CESAREAN SECTION (N/A )  Patient Location: PACU  Anesthesia Type:Epidural  Level of Consciousness: awake, alert  and oriented  Airway & Oxygen Therapy: Patient Spontanous Breathing  Post-op Assessment: Report given to RN and Post -op Vital signs reviewed and stable  Post vital signs: Reviewed and stable  Last Vitals:  Vitals Value Taken Time  BP 95/65 02/14/19 0200  Temp 36.7 C 02/14/19 0200  Pulse 67 02/14/19 0213  Resp 19 02/14/19 0213  SpO2 99 % 02/14/19 0213  Vitals shown include unvalidated device data.  Last Pain:  Vitals:   02/14/19 0200  TempSrc:   PainSc: 0-No pain      Patients Stated Pain Goal: 4 (02/13/19 0149)  Complications: No apparent anesthesia complications

## 2019-02-14 NOTE — Anesthesia Postprocedure Evaluation (Signed)
``````````````````````````````````````````````````````````````````````````````````````````````````````````````````````````````  [phone] Anesthesia Post Note  Patient: Connie Medina  Procedure(s) Performed: CESAREAN SECTION (N/A )     Patient location during evaluation: Mother Baby Anesthesia Type: Epidural Level of consciousness: awake and alert Pain management: pain level controlled Respiratory status: spontaneous breathing Cardiovascular status: stable Postop Assessment: no headache, adequate PO intake, no backache, patient able to bend at knees, able to ambulate, epidural receding and no apparent nausea or vomiting Anesthetic complications: no    Last Vitals:  Vitals:   02/14/19 0647 02/14/19 1100  BP: 132/79   Pulse: (!) 56   Resp:    Temp: 36.5 C 36.6 C  SpO2: 96% 99%    Last Pain:  Vitals:   02/14/19 1100  TempSrc: Oral  PainSc: 0-No pain   Pain Goal: Patients Stated Pain Goal: 4 (02/13/19 0149)                 Salome Arnt

## 2019-02-14 NOTE — Op Note (Signed)
PreOp Diagnosis: 1) Intrauterine pregnancy -Arrest of descent -Fetal intolerance to labor (NRFHT) PostOp Diagnosis: same Procedure: Primary C-section Surgeon: Dr. Myna Hidalgo Assistant: [facility], CNM Anesthesia: epidural Complications: none EBL: 1248cc UOP: 150cc Fluids: 3400cc  Findings: Female infant from vertex presentation, normal uterus, tubes and ovaries bilaterally  PROCEDURE:  Informed consent was obtained from the patient with risks, benefits, complications, treatment options, and expected outcomes discussed with the patient.  The patient concurred with the proposed plan, giving informed consent with form signed.   The patient was taken to Operating Room, and identified with the procedure verified as C-Section Delivery with Time Out. With induction of anesthesia, the patient was prepped and draped in the usual sterile fashion. A Pfannenstiel incision was made and carried down through the subcutaneous tissue to the fascia. The fascia was incised in the midline and extended transversely. The superior aspect of the fascial incision was grasped with Kochers elevated and the underlying muscle dissected off. The inferior aspect of the facial incision was in similar fashion, grasped elevated and rectus muscles dissected off. The peritoneum was identified and entered. Peritoneal incision was extended longitudinally. The utero-vesical peritoneal reflection was identified and incised transversely with the [facility] scissors, the incision extended laterally, the bladder flap created digitally. A low transverse uterine incision was made and the infants head delivered atraumatically. After the umbilical cord was clamped and cut cord blood was obtained for evaluation.   The placenta was removed intact and appeared normal. The uterine outline, tubes and ovaries appeared normal. The uterine incision was closed with running locked sutures of 0 Vicryl and a second layer of the same stitch was used in an  imbricating fashion.  Extension of the uterine incision was noted on left extending into the broad ligament and towards cervix.  Both 2-0 and 4-0 vicryl were used in a running locked fashion to re approximate the extension.  Hemostasis was obtained.  Uterine was noted to have some bogginess- Methergine IM was given and uterine tone was noted to improve.  The pericolic gutters were then cleared of all clots and debris. Arista was placed.  Peritoneum was closed in a running fashion.  The fascia was then reapproximated with running sutures of 0 Vicryl. The subcutaneous tissue was reapproximated with 2-0 plain gut suture.   The skin was closed with 4-0 vicryl in a subcuticular fashion.  Instrument, sponge, and needle counts were correct prior the abdominal closure and at the conclusion of the case. The patient was taken to recovery in stable condition.  Myna Hidalgo, DO [phone] (cell) [phone] (office)

## 2019-02-14 NOTE — Anesthesia Postprocedure Evaluation (Signed)
Anesthesia Post Note  Patient: [name]  Procedure(s) Performed: CESAREAN SECTION (N/A )     Patient location during evaluation: PACU Anesthesia Type: Epidural Level of consciousness: oriented and awake and alert Pain management: pain level controlled Vital Signs Assessment: post-procedure vital signs reviewed and stable Respiratory status: spontaneous breathing, respiratory function stable and nonlabored ventilation Cardiovascular status: blood pressure returned to baseline and stable Postop Assessment: no headache, no backache, no apparent nausea or vomiting and epidural receding Anesthetic complications: no    Last Vitals:  Vitals:   02/13/19 2248 02/14/19 0001  BP:  126/81  Pulse:  80  Resp:    Temp: 37.8 C   SpO2:      Last Pain:  Vitals:   02/13/19 2248  TempSrc: Oral  PainSc:    Pain Goal: Patients Stated Pain Goal: 4 (02/13/19 0149)                 [name]

## 2019-02-14 NOTE — Progress Notes (Signed)
At bedside to discuss management of care.  Late decels continue with each contractions following pushing.  Pt repositioned to her back- deep variable noted, pt turned back to her side.  Current exam C/C/+1.  Discussed management options- pt desires to proceed with primary C-section.  Risk benefits and alternatives of cesarean section were discussed with the patient including but not limited to infection, bleeding, damage to bowel , bladder and baby with the need for further surgery. Pt voiced understanding and desires to proceed.   Myna Hidalgo, DO [phone] (cell) [phone] (office)

## 2019-02-15 ENCOUNTER — Encounter: Payer: Self-pay | Admitting: *Deleted

## 2019-02-15 LAB — SURGICAL PATHOLOGY

## 2019-02-15 MED ORDER — FERROUS SULFATE 325 (65 FE) MG PO TABS
325.0000 mg | ORAL_TABLET | Freq: Two times a day (BID) | ORAL | Status: DC
  Administered 2019-02-16: 325 mg via ORAL
  Filled 2019-02-15 (×3): qty 1

## 2019-02-15 NOTE — Progress Notes (Signed)
Postop Note Day # 1  S:  Patient resting comfortable in bed.  Pain controlled.  Tolerating general. No flatus, no BM.  Lochia moderate.  Ambulating without difficulty.  She denies n/v/f/c, SOB, or CP.  Pt plans on breastfeeding.  O: Temp:  [97.8 F (36.6 C)-98 F (36.7 C)] 97.8 F (36.6 C) (01/15 0523) Pulse Rate:  [73-90] 86 (01/15 0523) Resp:  [18-20] 18 (01/15 0523) BP: (100-108)/(71-74) 108/74 (01/15 0523) SpO2:  [99 %] 99 % (01/14 1100)   Gen: A&Ox3, NAD CV: RRR, no MRG Resp: CTAB Abdomen: soft, NT, ND +BS Uterus: firm, non-tender, below umbilicus Incision:pressure dressing on- clean and dry Ext: No edema, no calf tenderness bilaterally, SCDs in place  Labs:  Recent Labs    02/13/19 0143 02/14/19 1231  HGB 9.6* 8.1*    A/P: Pt is a 29 y.o. [id number] s/p primary C-section, POD#1  - Pain well controlled -GU: UOP is adequate -GI: Tolerating general diet -Activity: encouraged sitting up to chair and ambulation as tolerated -Prophylaxis: SCDs  -pressure dressing to be removed today -Labs: stable as above, continue with iron twice daily  DISPO: Continue with routine postpartum/postop care  Myna Hidalgo, DO [phone] (cell) [phone] (office)

## 2019-02-16 ENCOUNTER — Encounter (HOSPITAL_COMMUNITY): Payer: Self-pay | Admitting: Obstetrics and Gynecology

## 2019-02-16 MED ORDER — OXYCODONE HCL 5 MG PO TABS: 5.0000 mg | ORAL_TABLET | Freq: Four times a day (QID) | ORAL | 0 refills | Status: AC | PRN

## 2019-02-16 MED ORDER — IBUPROFEN 600 MG PO TABS: 600.0000 mg | ORAL_TABLET | Freq: Four times a day (QID) | ORAL | 0 refills | Status: AC

## 2019-02-16 NOTE — Progress Notes (Signed)
CSW received consult due to score 10  on Edinburgh Depression Screen.    CSW spoke with MOB at bedside to address further needs. CSW congratulated MOB and FOB on the birth of infant. CSW advised MOB of the reason for CSW coming to visit with her. MOB reported that the past 7 days she has just been dealing with the "normal overwhelming things". CSW understanding of this and asked MOB how she has been feeling now. MOB reports that she is feeling "better".  MOB reported that she has nos mental health history and reported that she isnt in therapy or seeing a theraoust of any kind. MOB reported once again feeling better and no other concerns. MOB reports that she has all needed items to care for infant. MOB has support from her spouse during this time. CSW offered MOB therapy resources in which MOB declined needing at this time.   CSW provided education regarding Baby Blues vs PMADs and provided MOB with resources for mental health follow up.  CSW encouraged MOB to evaluate her mental health throughout the postpartum period with the use of the New Mom Checklist developed by [employer] as well as the Edinburgh Postnatal Depression Scale and notify a medical professional if symptoms arise.      Prachi Oftedahl S. Lounette Sloan, MSW, LCSW [facility] [phone]   

## 2019-02-16 NOTE — Lactation Note (Signed)
This note was copied from a baby's chart. Lactation Consultation Note  Patient Name: Connie Medina UJNWM'G Date: 02/16/2019 Reason for consult: Follow-up assessment Baby is 51 hours old/7% weight loss.  Mom has been latching baby easily and supplementing with donor milk.  She states she knows what to expect with milk coming to volume.  No questions or concerns.  Reviewed outpatient services and encouraged to call prn.  Maternal Data    Feeding Feeding Type: Donor Breast Milk Nipple Type: Slow - flow  LATCH Score                   Interventions    Lactation Tools Discussed/Used     Consult Status Consult Status: Complete Follow-up type: Call as needed    [name] 02/16/2019, 12:34 PM

## 2019-02-16 NOTE — Discharge Summary (Signed)
Obstetric Discharge Summary Connie Medina is a 29 year old G3P1 POD #2 who was admitted in active labor.  PT progressed to C/C/+1 however had NRFHT proceeding to a primary CS without BTL.  Postpartum Hemorrhage QBL 1300. PT Hgb now 8.1-9.6. Completely asymptomatic Undecided on Birth Control  Hemoglobin  Date Value Ref Range Status  02/14/2019 8.1 (L) 12.0 - 15.0 g/dL Final   HCT  Date Value Ref Range Status  02/14/2019 26.1 (L) 36.0 - 46.0 % Final    Physical Exam:  General: alert, cooperative and no distress Lochia: appropriate Uterine Fundus: firm Incision: Honeycomb dressing clean, dry, intact DVT Evaluation: No evidence of DVT seen on physical exam. VS temp 98.2, BP 117/75, Pulse 82, RR 18  Discharge Diagnoses: Cesarean Delivery  Discharge Information: Date: 02/16/2019 Activity: pelvic rest Diet: routine Medications: Ibuprofen and Percocet Condition: stable Instructions: refer to practice specific booklet Discharge to: home Follow-up Information    [facility]. Call in 6 week(s).           Newborn Data: Live born female  Birth Weight: 8 lb 15.7 oz (4074 g)   Newborn Delivery   Birth date/time: 02/14/2019 00:51:00 Delivery type: C-Section, Low Transverse Trial of labor: Yes C-section categorization: Primary      Home with mother.  Connie Medina 02/16/2019, 10:07 AM

## 2019-02-18 ENCOUNTER — Inpatient Hospital Stay (HOSPITAL_COMMUNITY)
Admission: AD | Admit: 2019-02-18 | Payer: Medicaid Other | Source: Home / Self Care | Admitting: Obstetrics and Gynecology

## 2019-02-18 ENCOUNTER — Inpatient Hospital Stay (HOSPITAL_COMMUNITY): Payer: Medicaid Other

## 2020-02-05 ENCOUNTER — Other Ambulatory Visit: Payer: Medicaid Other

## 2020-02-23 IMAGING — US US MFM FETAL BPP W/O NON-STRESS
1 series · 7 of 7 positions shown · non-contrast
Comparison: none

[Series 1: us mfm fetal bpp w/o non-stress · 7 acquisitions, 7 frames shown]
[im 1/7]
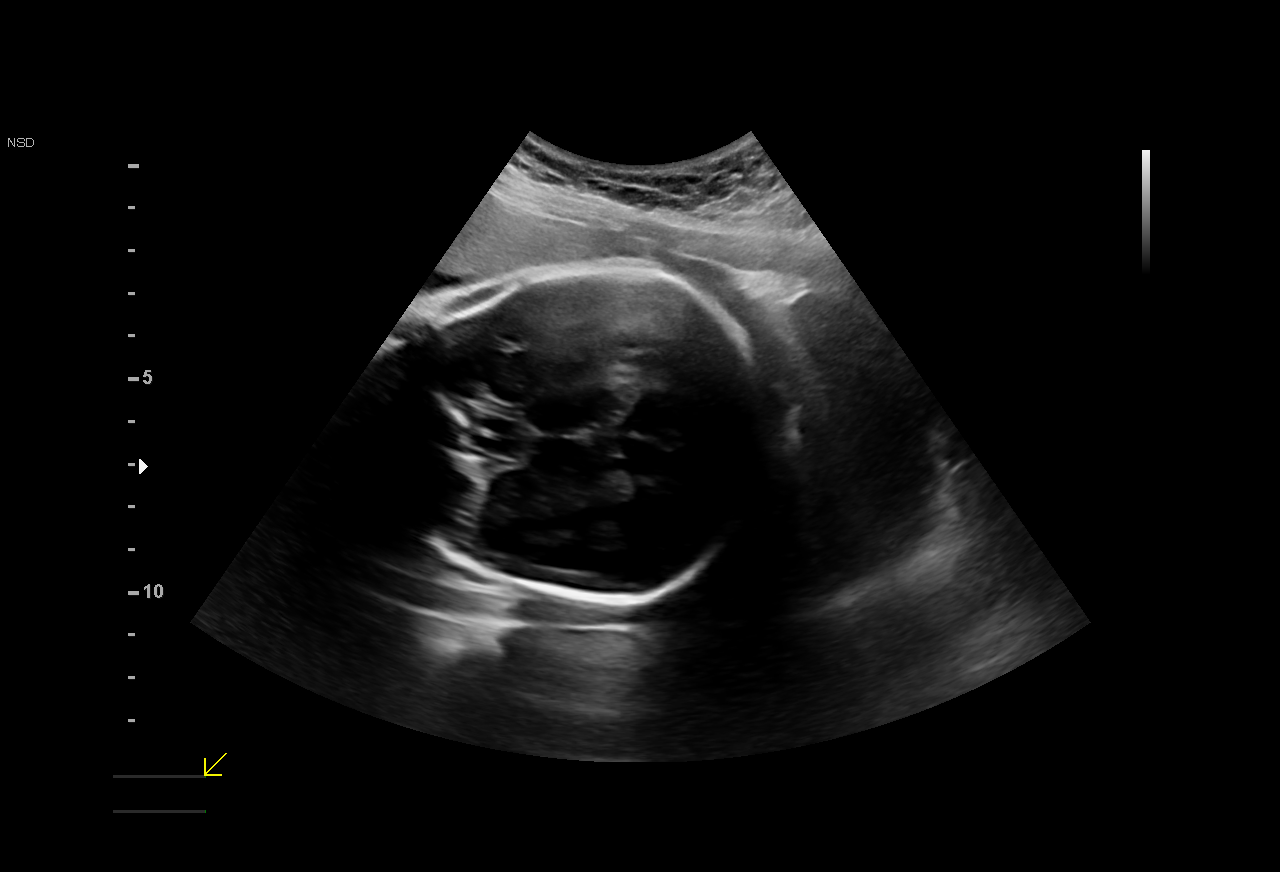
[im 2/7]
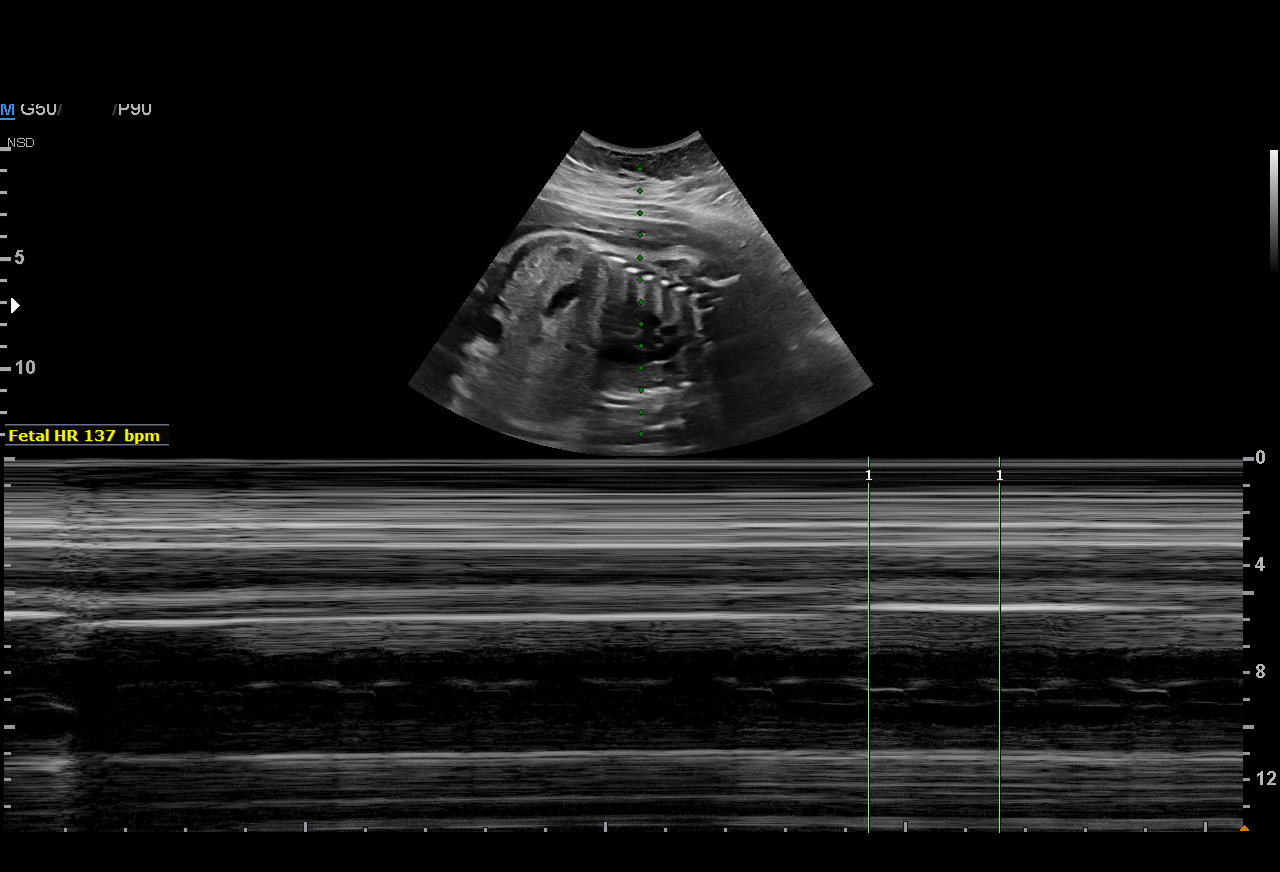
[im 3/7]
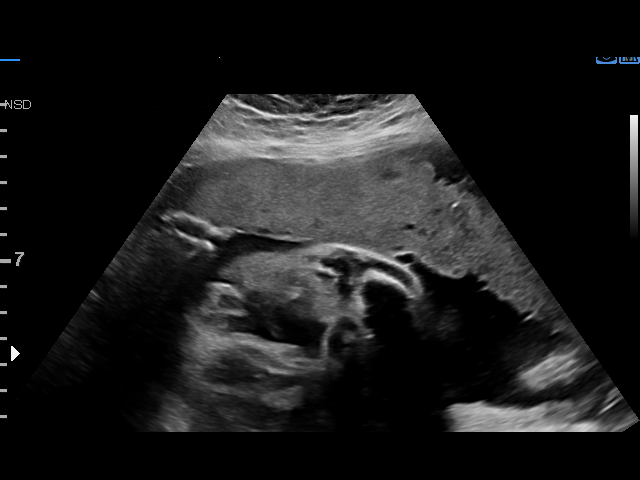
[im 4/7]
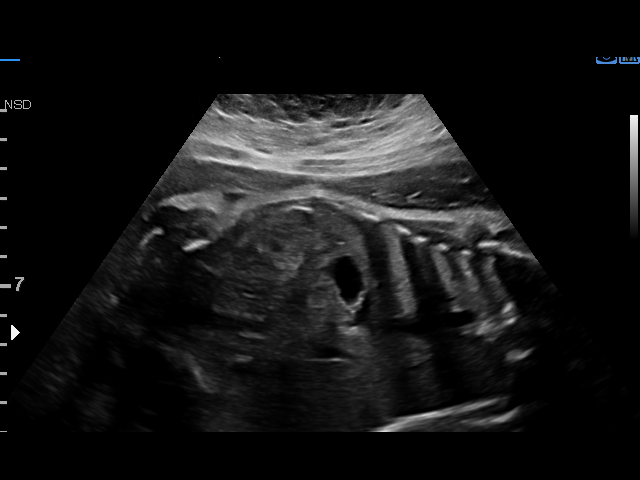
[im 5/7]
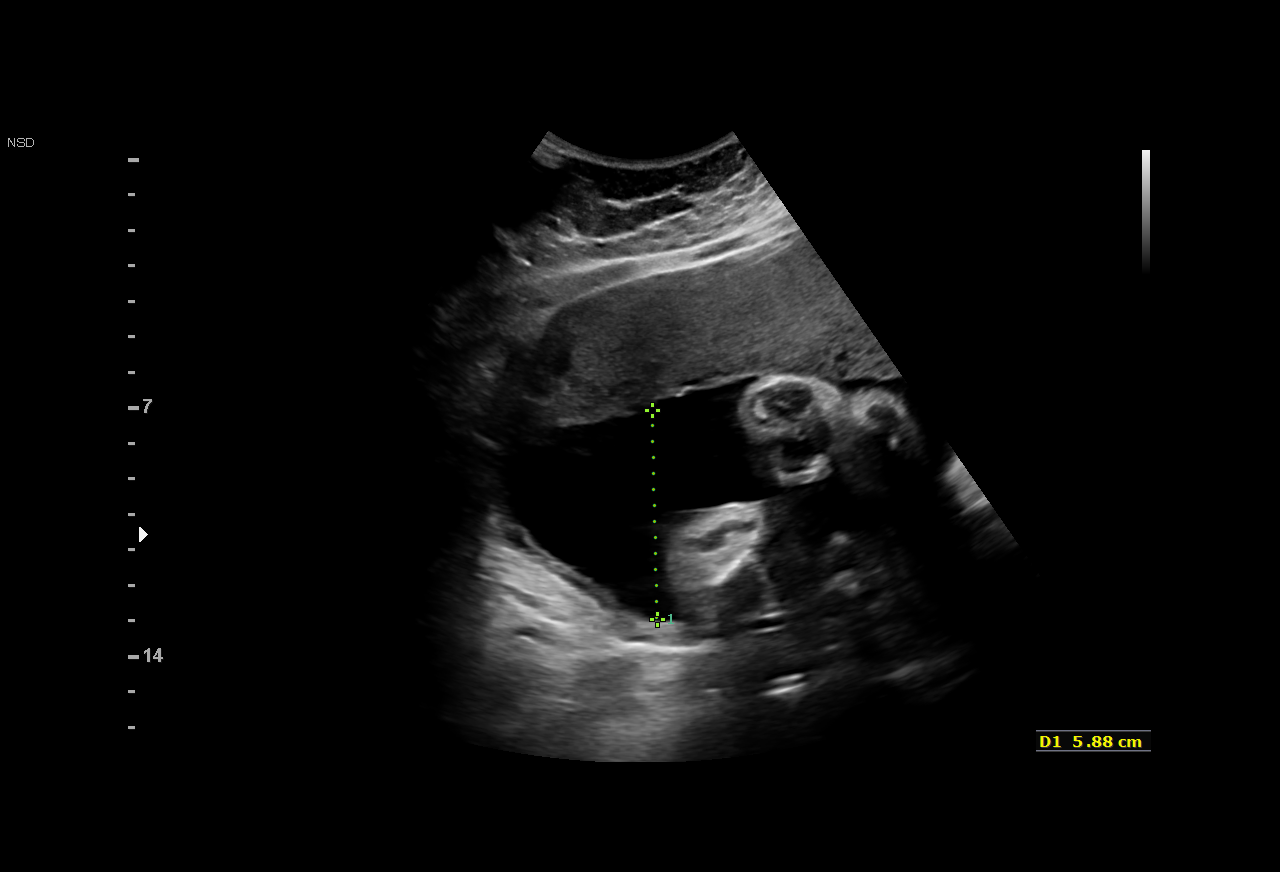
[im 6/7]
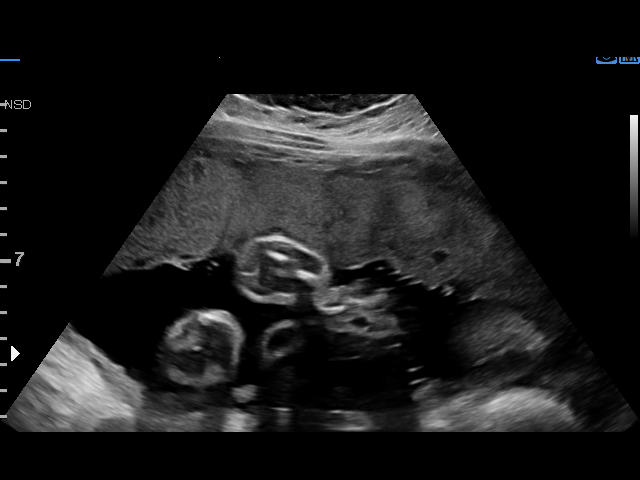
[im 7/7]
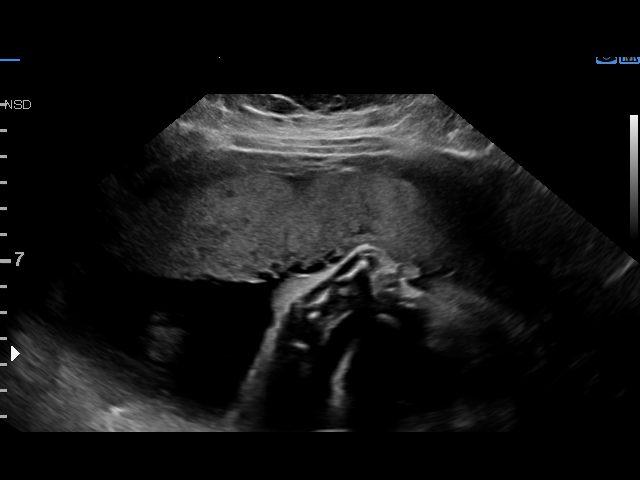

[7 of 7 positions shown; findings below may reference images not displayed]

MAU/Triage

Indications

30 weeks gestation of pregnancy
Encounter for other antenatal screening
follow-up ([DATE] BPP yesterday)- Repeat
Decreased fetal movements, third trimester,
unspecified
OB History

Gravidity:    1         Term:   0        Prem:   0        SAB:   0
TOP:          0       Ectopic:  0        Living: 0
Fetal Evaluation

Num Of Fetuses:     1
Fetal Heart         137
Rate(bpm):
Cardiac Activity:   Observed
Presentation:       Cephalic

Amniotic Fluid
AFI FV:      Subjectively within normal limits

AFI Sum(cm)     %Tile       Largest Pocket(cm)
11.81           28

RUQ(cm)       RLQ(cm)       LUQ(cm)        LLQ(cm)
3.32
Biophysical Evaluation

Amniotic F.V:   Pocket => 2 cm two         F. Tone:        Observed
planes
F. Movement:    Observed                   Score:          [DATE]
F. Breathing:   Observed
Gestational Age

LMP:           30w 2d        Date:  09/01/16                 EDD:   06/08/17
Best:          30w 2d     Det. By:  LMP  (09/01/16)          EDD:   06/08/17
Impression

Intrauterine pregnancy at 30+2 weeks with decreased fetal
movement
Normal amniotic fluid
BPP [DATE]
Recommendations

Continue clinical evaluation and management.

## 2020-03-24 IMAGING — US US MFM FETAL BPP W/O NON-STRESS
1 series · 12 of 15 positions shown · non-contrast
Comparison: none

[Series 1: us mfm fetal bpp w/o non-stress · 15 acquisitions, 12 frames shown]
[im 1/15]
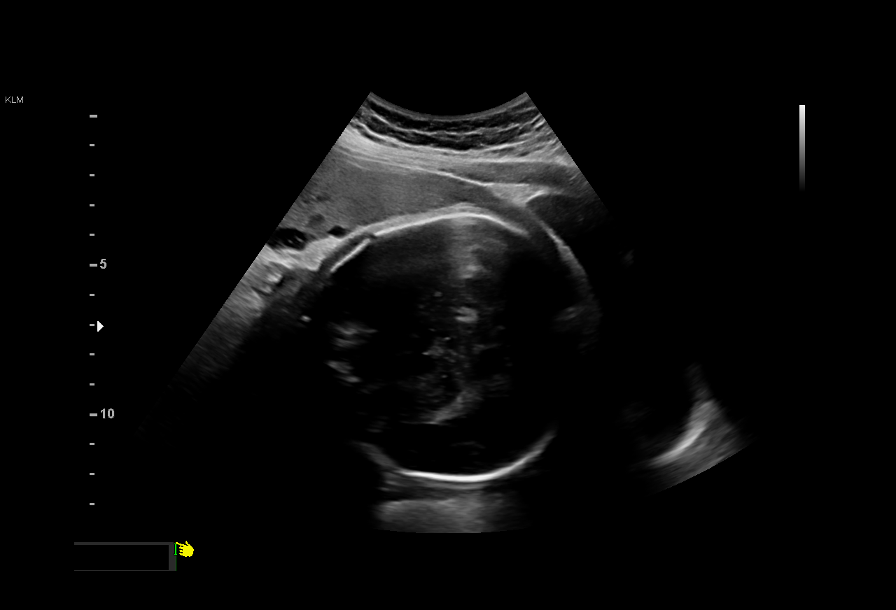
[im 2/15]
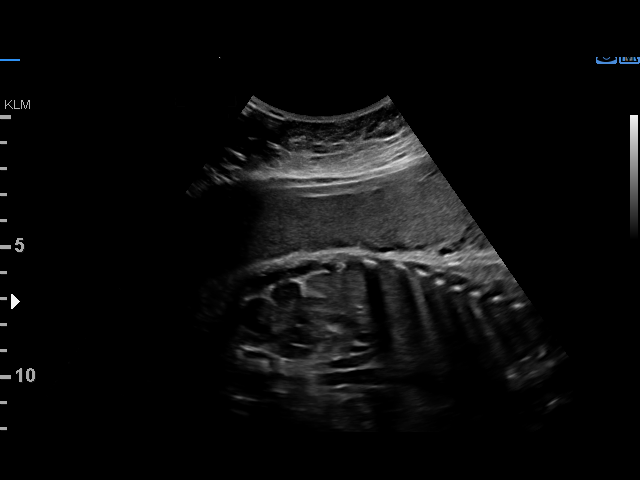
[im 4/15]
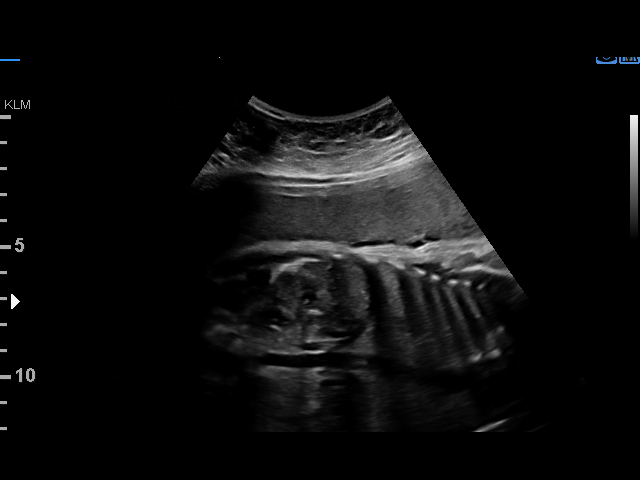
[im 5/15]
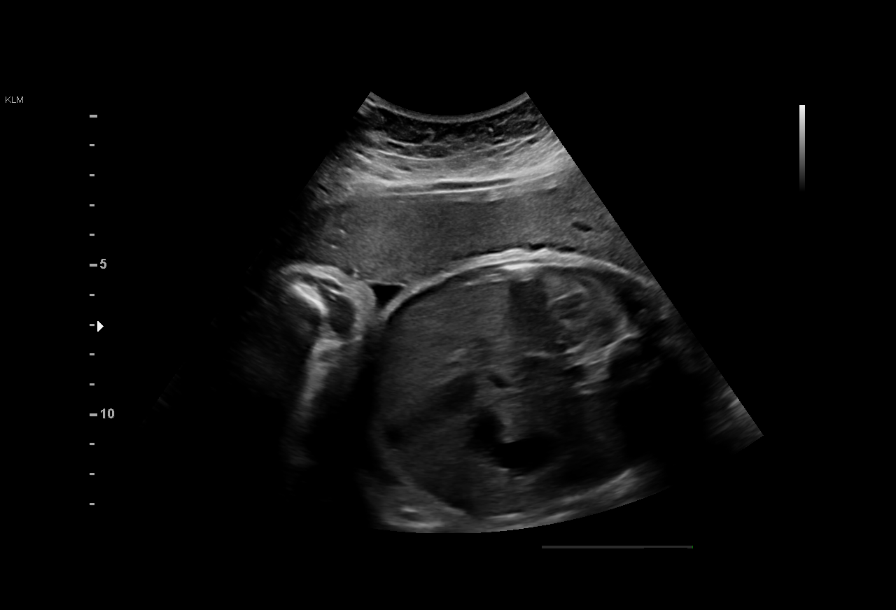
[im 6/15]
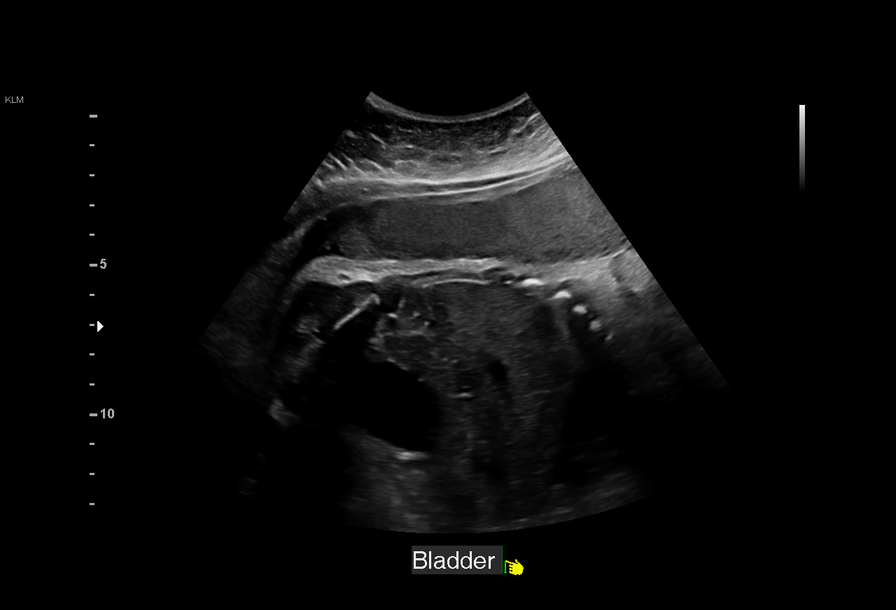
[im 7/15]
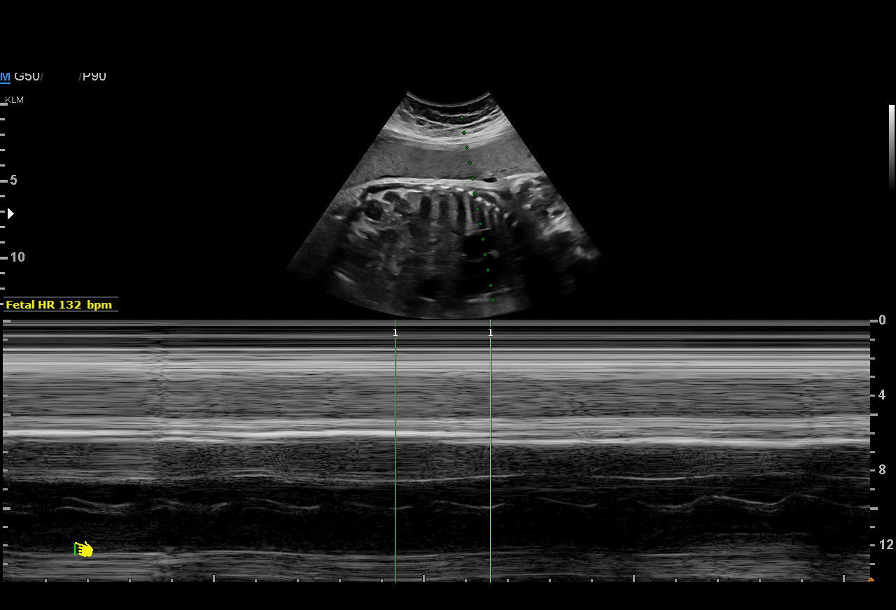
[im 9/15]
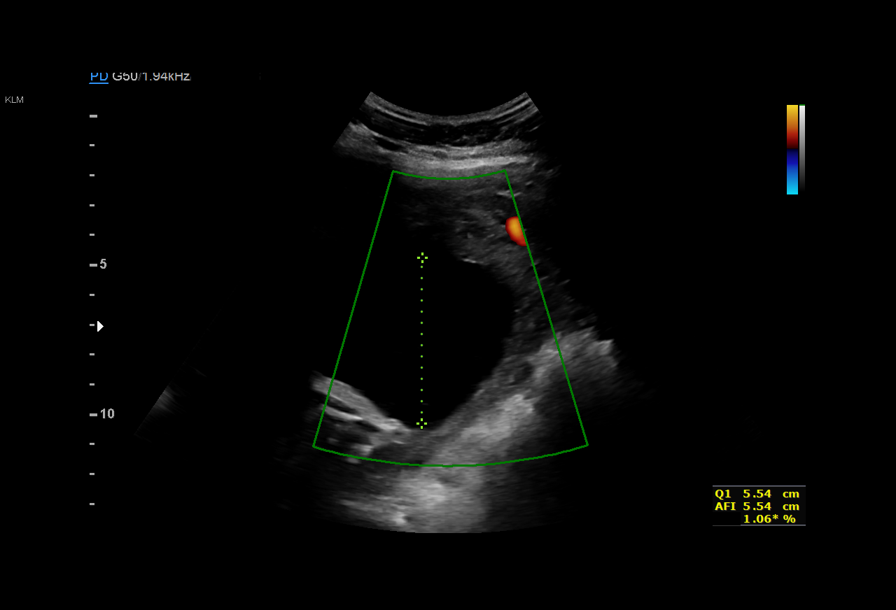
[im 10/15]
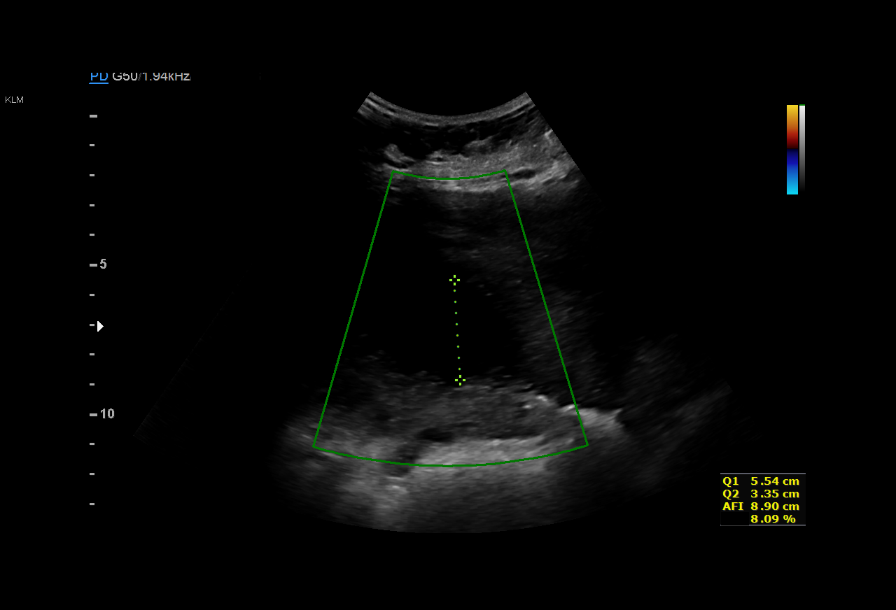
[im 11/15]
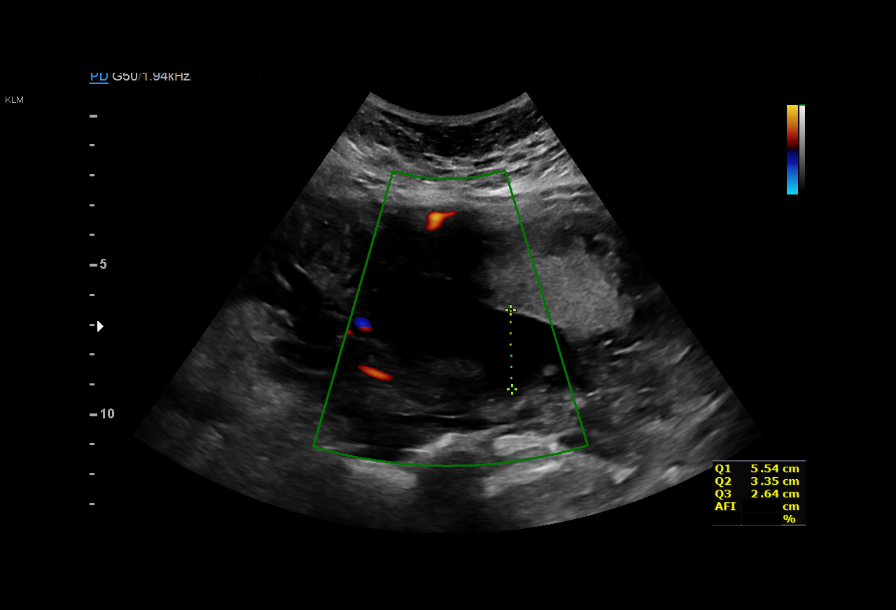
[im 12/15]
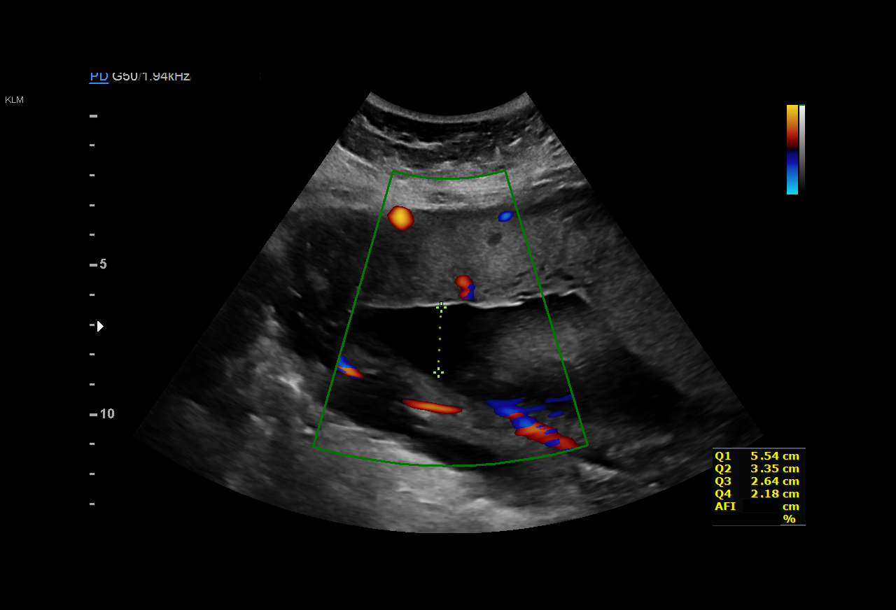
[im 14/15]
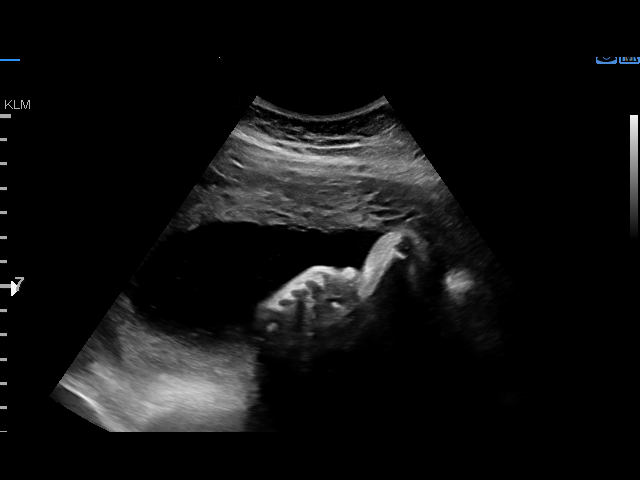
[im 15/15]
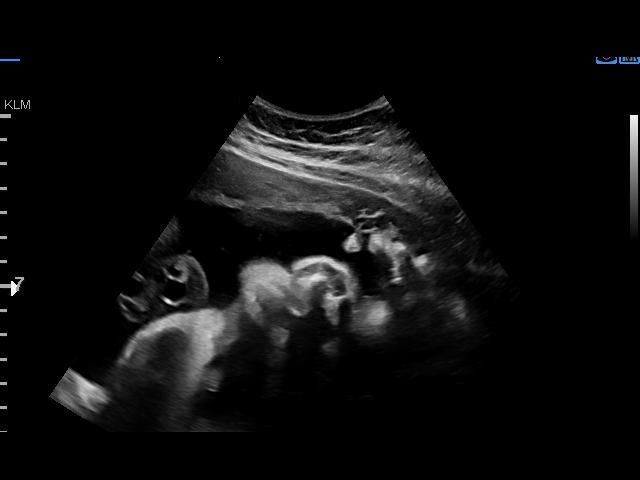

[12 of 15 positions shown; findings below may reference images not displayed]

MAU/Triage
S AJMAL CNM

1  FERNANDO LUIZ STOLF          [id number]      [id number]     [id number]
Indications

34 weeks gestation of pregnancy
Decreased fetal movements, third trimester,
unspecified
OB History

Gravidity:    1         Term:   0        Prem:   0        SAB:   0
TOP:          0       Ectopic:  0        Living: 0
Fetal Evaluation

Num Of Fetuses:     1
Fetal Heart         132
Rate(bpm):
Cardiac Activity:   Observed
Presentation:       Cephalic

Amniotic Fluid
AFI FV:      Subjectively within normal limits

AFI Sum(cm)     %Tile       Largest Pocket(cm)
13.7            47

RUQ(cm)       RLQ(cm)       LUQ(cm)        LLQ(cm)
5.5

Comment:    [DATE] BPP in 5 minutes.
Biophysical Evaluation
Amniotic F.V:   Within normal limits       F. Tone:        Observed
F. Movement:    Observed                   Score:          [DATE]
F. Breathing:   Observed
Gestational Age

LMP:           34w 4d        Date:  09/01/16                 EDD:   06/08/17
Best:          34w 4d     Det. By:  LMP  (09/01/16)          EDD:   06/08/17
Impression

Intrauterine pregnancy at 34+4 weeks with decreased fetal
movement
Normal amniotic fluid
BPP [DATE]
Recommendations

Continue clinical evaluation and management.

## 2021-12-10 ENCOUNTER — Other Ambulatory Visit: Payer: Self-pay

## 2021-12-10 DIAGNOSIS — M5416 Radiculopathy, lumbar region: Secondary | ICD-10-CM

## 2021-12-14 ENCOUNTER — Encounter (INDEPENDENT_AMBULATORY_CARE_PROVIDER_SITE_OTHER): Payer: Self-pay

## 2021-12-14 ENCOUNTER — Other Ambulatory Visit: Payer: Self-pay | Admitting: Physical Medicine and Rehabilitation

## 2021-12-14 DIAGNOSIS — M5416 Radiculopathy, lumbar region: Secondary | ICD-10-CM

## 2022-01-11 ENCOUNTER — Ambulatory Visit
Admission: RE | Admit: 2022-01-11 | Discharge: 2022-01-11 | Disposition: A | Discharge status: Home / Self Care | Payer: Medicaid Other | Source: Ambulatory Visit | Attending: Physical Medicine and Rehabilitation | Admitting: Physical Medicine and Rehabilitation

## 2022-01-11 DIAGNOSIS — M5416 Radiculopathy, lumbar region: Secondary | ICD-10-CM

## 2022-01-12 ENCOUNTER — Encounter (INDEPENDENT_AMBULATORY_CARE_PROVIDER_SITE_OTHER): Payer: Self-pay | Admitting: Family Medicine

## 2022-01-27 ENCOUNTER — Other Ambulatory Visit: Payer: Self-pay

## 2022-01-27 ENCOUNTER — Ambulatory Visit: Payer: Medicaid Other | Attending: Physical Medicine and Rehabilitation

## 2022-01-27 DIAGNOSIS — R262 Difficulty in walking, not elsewhere classified: Secondary | ICD-10-CM | POA: Insufficient documentation

## 2022-01-27 DIAGNOSIS — M7918 Myalgia, other site: Secondary | ICD-10-CM | POA: Diagnosis present

## 2022-01-27 DIAGNOSIS — R252 Cramp and spasm: Secondary | ICD-10-CM | POA: Insufficient documentation

## 2022-01-27 DIAGNOSIS — M6281 Muscle weakness (generalized): Secondary | ICD-10-CM | POA: Insufficient documentation

## 2022-01-27 NOTE — Therapy (Signed)
OUTPATIENT PHYSICAL THERAPY LOWER EXTREMITY EVALUATION   Patient Name: Connie Medina MRN: [id number] DOB:12/30/1990, 31 y.o., female Today's Date: 01/27/2022  END OF SESSION:  PT End of Session - 01/27/22 1612     Visit Number 1    Date for PT Re-Evaluation 03/24/22    Authorization Type  MEDICAID HEALTHY BLUE    PT Start Time 1605    PT Stop Time 1648    PT Time Calculation (min) 43 min    Activity Tolerance Patient tolerated treatment well    Behavior During Therapy WFL for tasks assessed/performed             Past Medical History:  Diagnosis Date   Infection    UTI   Migraines    Past Surgical History:  Procedure Laterality Date   CESAREAN SECTION N/A 02/13/2019   Procedure: CESAREAN SECTION;  Surgeon: Myna Hidalgo, DO;  Location: [facility];  Service: Obstetrics;  Laterality: N/A;   TONSILLECTOMY AND ADENOIDECTOMY Bilateral 09/30/2014   Procedure: BILATERAL TONSILLECTOMY AND ADENOIDECTOMY;  Surgeon: Newman Pies, MD;  Location: [facility];  Service: ENT;  Laterality: Bilateral;   Patient Active Problem List   Diagnosis Date Noted   Cesarean delivery delivered 02/16/2019   Postpartum hemorrhage 02/16/2019   Normal labor 02/13/2019    PCP: no PCP per patient  REFERRING PROVIDER: Callie Fielding, MD  REFERRING DIAG: M79.18 (ICD-10-CM) - Myalgia, other site  THERAPY DIAG:  Myalgia, other site  Muscle weakness (generalized)  Cramp and spasm  Difficulty in walking, not elsewhere classified  Rationale for Evaluation and Treatment: Rehabilitation  ONSET DATE: 01/27/2022  SUBJECTIVE:   SUBJECTIVE STATEMENT: Patient states she does not work.  She stays at home with 2 toddlers.  She admits she just doesn't have time for herself and tried going to the gym one time but she just had more pain.     PERTINENT HISTORY: none PAIN:  Are you having pain? Yes: NPRS scale: 0/10 Pain location: lumbar spine Pain description: aching  Aggravating  factors: sleeping on her belly and with standing and bending too long Relieving factors: Lies on the floor  PRECAUTIONS: None  WEIGHT BEARING RESTRICTIONS: No  FALLS:  Has patient fallen in last 6 months? 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    PATIENT SURVEYS:  Oswestry: 16 LEFS: 50/80= 62.5%  COGNITION: Overall cognitive status: Within functional limits for tasks assessed     SENSATION: WFL   MUSCLE LENGTH: Hamstrings: Right 60 deg; Left 70 deg Thomas test: Right pos ; Left pos   POSTURE: increased lumbar lordosis   LOWER EXTREMITY ROM:  All WFL  LOWER EXTREMITY MMT:  Generally 4 to 4+/5   SLR test:   negative Slump test:  negative  FUNCTIONAL TESTS:  5 times sit to stand: complete next visit Timed up and go (TUG): complete next visit  GAIT: Distance walked: 30 Assistive device utilized: None  Level of assistance: Complete Independence Comments: slow antalgic   TODAY'S TREATMENT:                                                                                                                              DATE: 01/27/22 Initial eval completed and initiated HEP (any treatment provided on this visit was not billed due to insurance restriction)   PATIENT EDUCATION:  Education details: Initiated HEP Person educated: Patient Education  method: Programmer, multimedia, Facilities manager, Actor cues, Verbal cues, and Handouts Education comprehension: verbalized understanding, returned demonstration, and verbal cues required  HOME EXERCISE PROGRAM: Access Code: [facility] URL: [email or web address] Date: 01/27/2022 Prepared by: Mikey Kirschner  Exercises - Supine Hamstring Stretch with Strap  - 1 x daily - 7 x weekly - 1 sets - 3 reps - 30 sec hold - Standing Hamstring Stretch on Chair  - 1 x daily - 7 x weekly - 1 sets - 3 reps - 30 sec hold - Standing Quad Stretch with Table and Chair Support  - 1 x daily - 7 x weekly - 1 sets - 3 reps - 30 sec hold - Supine ITB Stretch with Strap  - 1 x daily - 7 x weekly - 1 sets - 3 reps - 30 sec hold - Supine Figure 4 Piriformis Stretch  - 1 x daily - 7 x weekly - 1 sets - 3 reps - 30 sec hold  ASSESSMENT:  CLINICAL IMPRESSION: Patient is a 31 y.o. female who was seen today for physical therapy evaluation and treatment for generalized myalgia. Her symptoms seem to be more related to low back pain and possibly kinetic chain issues stemming from this.  She has hypermobile lumbar spine with tightness in bilateral hamstrings and hip flexor/quad areas.  She has asymmetrical tight areas in hip ER's and IR's.  She is also dealing with pain cycle issues that may be resolved with education on the pain cycle and how to manage this better.  She would benefit from LE flexibility, core strengthening, possibly dry needling and modalities to control pain.    OBJECTIVE IMPAIRMENTS: difficulty walking, decreased strength, increased fascial restrictions, increased muscle spasms, impaired flexibility, and pain.   ACTIVITY LIMITATIONS: carrying, lifting, bending, sitting, standing, squatting, sleeping, stairs, transfers, bed mobility, and dressing  PARTICIPATION LIMITATIONS: meal prep, cleaning, laundry, interpersonal relationship, driving, shopping, community activity, occupation, and yard work  PERSONAL  FACTORS: Behavior pattern, Education, Fitness, and Past/current experiences are also affecting patient's functional outcome.   REHAB POTENTIAL: Good  CLINICAL DECISION MAKING: Stable/uncomplicated  EVALUATION COMPLEXITY: Low   GOALS: Goals reviewed with patient? Yes  SHORT TERM GOALS: Target date: 02/24/2022   Pain report to be no greater than 4/10  Baseline: Goal status: INITIAL  2.  Patient will be independent with initial HEP  Baseline:  Goal status: INITIAL   LONG TERM GOALS: Target date: 03/24/2022    Patient to report pain no greater than 2/10  Baseline:  Goal  status: INITIAL  2.  Patient to be independent with advanced HEP  Baseline:  Goal status: INITIAL  3.  Patient to report 85% improvement in overall symptoms  Baseline:  Goal status: INITIAL  4.  LEFS score to improve by 10 points Baseline:  Goal status: INITIAL  5.  Patient to begin regular exercise routine Baseline:  Goal status: INITIAL   PLAN:  PT FREQUENCY: 1-2x/week  PT DURATION: 8 weeks  PLANNED INTERVENTIONS: Therapeutic exercises, Therapeutic activity, Neuromuscular re-education, Balance training, Gait training, Patient/Family education, Self Care, Joint mobilization, Stair training, DME instructions, Aquatic Therapy, Dry Needling, Electrical stimulation, Spinal mobilization, Cryotherapy, Moist heat, Taping, Vasopneumatic device, Traction, Manual therapy, and Re-evaluation  PLAN FOR NEXT SESSION: Review HEP, DN if patient consents   Victorino Dike B. Sumayya Muha, PT 01/27/22 5:01 PM

## 2022-01-28 ENCOUNTER — Ambulatory Visit: Payer: Medicaid Other

## 2022-01-28 DIAGNOSIS — M7918 Myalgia, other site: Secondary | ICD-10-CM | POA: Diagnosis not present

## 2022-01-28 DIAGNOSIS — R252 Cramp and spasm: Secondary | ICD-10-CM

## 2022-01-28 DIAGNOSIS — R262 Difficulty in walking, not elsewhere classified: Secondary | ICD-10-CM

## 2022-01-28 DIAGNOSIS — M6281 Muscle weakness (generalized): Secondary | ICD-10-CM

## 2022-01-28 NOTE — Therapy (Signed)
OUTPATIENT PHYSICAL THERAPY LOWER EXTREMITY TREATMENT NOTE   Patient Name: Connie Medina MRN: [id number] DOB:02/22/1990, 31 y.o., female Today's Date: 01/28/2022  END OF SESSION:  PT End of Session - 01/28/22 1016     Visit Number 2    Number of Visits 27    Date for PT Re-Evaluation 03/24/22    Authorization Type [place] MEDICAID HEALTHY BLUE    Authorization - Visit Number 2    Authorization - Number of Visits 27    Progress Note Due on Visit 10    PT Start Time 1017    PT Stop Time 1100    PT Time Calculation (min) 43 min    Activity Tolerance Patient tolerated treatment well    Behavior During Therapy WFL for tasks assessed/performed             Past Medical History:  Diagnosis Date   Infection    UTI   Migraines    Past Surgical History:  Procedure Laterality Date   CESAREAN SECTION N/A 02/13/2019   Procedure: CESAREAN SECTION;  Surgeon: Myna Hidalgo, DO;  Location: [facility];  Service: Obstetrics;  Laterality: N/A;   TONSILLECTOMY AND ADENOIDECTOMY Bilateral 09/30/2014   Procedure: BILATERAL TONSILLECTOMY AND ADENOIDECTOMY;  Surgeon: Newman Pies, MD;  Location: [facility];  Service: ENT;  Laterality: Bilateral;   Patient Active Problem List   Diagnosis Date Noted   Cesarean delivery delivered 02/16/2019   Postpartum hemorrhage 02/16/2019   Normal labor 02/13/2019    PCP: no PCP per patient  REFERRING PROVIDER: Callie Fielding, MD  REFERRING DIAG: M79.18 (ICD-10-CM) - Myalgia, other site  THERAPY DIAG:  Myalgia, other site  Muscle weakness (generalized)  Cramp and spasm  Difficulty in walking, not elsewhere classified  Rationale for Evaluation and Treatment: Rehabilitation  ONSET DATE: 01/27/2022  SUBJECTIVE:   SUBJECTIVE STATEMENT: Patient states she only had time to do one of the exercises.  She focused on the piriformis stretch.  She rates her pain today at 0/10.  PERTINENT HISTORY: none PAIN:  Are you having pain? Yes: NPRS  scale: 0/10 Pain location: lumbar spine Pain description: aching  Aggravating factors: sleeping on her belly and with standing and bending too long Relieving factors: Lies on the floor  PRECAUTIONS: None  WEIGHT BEARING RESTRICTIONS: No  FALLS:  Has patient fallen in last 6 months? 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    PATIENT SURVEYS:  Oswestry: 16 LEFS: 50/80= 62.5%  COGNITION: Overall cognitive status: Within functional limits for tasks assessed     SENSATION: WFL   MUSCLE LENGTH: Hamstrings: Right 60 deg; Left 70 deg Thomas test: Right pos ; Left pos   POSTURE: increased lumbar lordosis   LOWER EXTREMITY ROM:  All WFL  LOWER EXTREMITY MMT:  Generally 4 to 4+/5   SLR test:   negative Slump test:  negative  FUNCTIONAL TESTS:  5 times sit to stand:  complete next visit Timed up and go (TUG): complete next visit  GAIT: Distance walked: 30 Assistive device utilized: None Level of assistance: Complete Independence Comments: slow antalgic   TODAY'S TREATMENT:                                                                                                                              DATE: 01/28/22 Nustep x 5 min level 5 Reviewed HEP: hamstring, IT band and piriformis stretch 2 x 20 sec each PPT x 20 PPT with 90/90 heel tap x 20 (needed  heavy vc's for maintaining neutral pelvis) PPT with dying bug x 20 (decreased vc's needed for neutral pelvis Hooklying clam x 20 with blue loop Side lying clam x 20 with blue loop both Ice to lumbar spine x 10 min at end of treatment  DATE: 01/27/22 Initial eval completed and initiated HEP (any treatment provided on this visit was not billed due to insurance restriction)   PATIENT EDUCATION:  Education details: Initiated HEP Person educated: Patient Education method: Programmer, multimedia, Facilities manager, Actor cues, Verbal cues, and Handouts Education comprehension: verbalized understanding, returned demonstration, and verbal cues required  HOME EXERCISE PROGRAM: Access Code: [email or web address] URL: [email or web address] Date: 01/27/2022 Prepared by: Mikey Kirschner  Exercises - Supine Hamstring Stretch with Strap  - 1 x daily - 7 x weekly - 1 sets - 3 reps - 30 sec hold - Standing Hamstring Stretch on Chair  - 1 x daily - 7 x weekly - 1 sets - 3 reps - 30 sec hold - Standing Quad Stretch with Table and Chair Support  - 1 x daily - 7 x weekly - 1 sets - 3 reps - 30 sec hold - Supine ITB Stretch with Strap  - 1 x daily - 7 x weekly - 1 sets - 3 reps - 30 sec hold - Supine Figure 4 Piriformis Stretch  - 1 x daily - 7 x weekly - 1 sets - 3 reps - 30 sec hold  ASSESSMENT:  CLINICAL IMPRESSION: [name] was able to complete all core exercises with mod vc's for correct technique.  She had no pain throughout review of HEP and new exercises.  We attempted ice post treatment to see how she responds to this.  She would benefit from continuing skilled PT for LE strength and flexibility along with core strengthening to restore patient to prior level of function.    OBJECTIVE IMPAIRMENTS: difficulty walking, decreased strength, increased fascial restrictions, increased muscle spasms, impaired flexibility, and pain.   ACTIVITY LIMITATIONS: carrying, lifting, bending, sitting, standing, squatting,  sleeping, stairs, transfers, bed mobility, and dressing  PARTICIPATION LIMITATIONS: meal prep, cleaning, laundry, interpersonal relationship, driving, shopping, community activity, occupation, and yard work  PERSONAL FACTORS: Behavior pattern, Education, Fitness, and Past/current experiences are also affecting patient's functional outcome.   REHAB POTENTIAL: Good  CLINICAL DECISION MAKING: Stable/uncomplicated  EVALUATION COMPLEXITY: Low   GOALS: Goals reviewed with patient? Yes  SHORT TERM GOALS: Target  date: 02/24/2022   Pain report to be no greater than 4/10  Baseline: Goal status: INITIAL  2.  Patient will be independent with initial HEP  Baseline:  Goal status: INITIAL   LONG TERM GOALS: Target date: 03/24/2022    Patient to report pain no greater than 2/10  Baseline:  Goal status: INITIAL  2.  Patient to be independent with advanced HEP  Baseline:  Goal status: INITIAL  3.  Patient to report 85% improvement in overall symptoms  Baseline:  Goal status: INITIAL  4.  LEFS score to improve by 10 points Baseline:  Goal status: INITIAL  5.  Patient to begin regular exercise routine Baseline:  Goal status: INITIAL   PLAN:  PT FREQUENCY: 1-2x/week  PT DURATION: 8 weeks  PLANNED INTERVENTIONS: Therapeutic exercises, Therapeutic activity, Neuromuscular re-education, Balance training, Gait training, Patient/Family education, Self Care, Joint mobilization, Stair training, DME instructions, Aquatic Therapy, Dry Needling, Electrical stimulation, Spinal mobilization, Cryotherapy, Moist heat, Taping, Vasopneumatic device, Traction, Manual therapy, and Re-evaluation  PLAN FOR NEXT SESSION: Progress core strength and bilateral LE strength as tolerated.  DN if patient consents and this is indicated based on symptoms.     Victorino Dike B. Betzabeth Derringer, PT 01/28/22 10:58 AM  [facility] [address] [place], [place] [address] Phone #  [phone] Fax [phone]

## 2022-02-01 ENCOUNTER — Ambulatory Visit: Payer: Medicaid Other | Admitting: Physical Therapy

## 2022-02-01 ENCOUNTER — Ambulatory Visit: Payer: Medicaid Other | Attending: Physical Medicine and Rehabilitation | Admitting: Physical Therapy

## 2022-02-01 DIAGNOSIS — M6281 Muscle weakness (generalized): Secondary | ICD-10-CM | POA: Diagnosis present

## 2022-02-01 DIAGNOSIS — R262 Difficulty in walking, not elsewhere classified: Secondary | ICD-10-CM | POA: Insufficient documentation

## 2022-02-01 DIAGNOSIS — R252 Cramp and spasm: Secondary | ICD-10-CM | POA: Insufficient documentation

## 2022-02-01 DIAGNOSIS — M7918 Myalgia, other site: Secondary | ICD-10-CM | POA: Diagnosis present

## 2022-02-01 NOTE — Therapy (Signed)
OUTPATIENT PHYSICAL THERAPY LOWER EXTREMITY TREATMENT NOTE   Patient Name: Connie Medina MRN: [id number] DOB:1990/10/04, 32 y.o., female Today's Date: 01/28/2022  END OF SESSION:  PT End of Session - 02/01/22 0903     Visit Number 3    Number of Visits 27    Date for PT Re-Evaluation 03/24/22    Authorization Type [place] MEDICAID HEALTHY BLUE    Authorization - Visit Number 3    Authorization - Number of Visits 27    Progress Note Due on Visit 10    PT Start Time [phone]    PT Stop Time 0930    PT Time Calculation (min) 39 min    Activity Tolerance Patient tolerated treatment well               Past Medical History:  Diagnosis Date   Infection    UTI   Migraines    Past Surgical History:  Procedure Laterality Date   CESAREAN SECTION N/A 02/13/2019   Procedure: CESAREAN SECTION;  Surgeon: Janyth Pupa, DO;  Location: [facility];  Service: Obstetrics;  Laterality: N/A;   TONSILLECTOMY AND ADENOIDECTOMY Bilateral 09/30/2014   Procedure: BILATERAL TONSILLECTOMY AND ADENOIDECTOMY;  Surgeon: Leta Baptist, MD;  Location: [facility];  Service: ENT;  Laterality: Bilateral;   Patient Active Problem List   Diagnosis Date Noted   Cesarean delivery delivered 02/16/2019   Postpartum hemorrhage 02/16/2019   Normal labor 02/13/2019    PCP: no PCP per patient  REFERRING PROVIDER: Marlaine Hind, MD  REFERRING DIAG: M79.18 (ICD-10-CM) - Myalgia, other site  THERAPY DIAG:  Myalgia, other site  Muscle weakness (generalized)  Cramp and spasm  Difficulty in walking, not elsewhere classified  Rationale for Evaluation and Treatment: Rehabilitation  ONSET DATE: 01/27/2022  SUBJECTIVE:   SUBJECTIVE STATEMENT: Pain with bending and physical activity PERTINENT HISTORY: none PAIN:  Are you having pain? Yes: NPRS scale: 0/10 Pain location: lumbar spine Pain description: aching  Aggravating factors: sleeping on her belly and with standing and bending too  long Relieving factors: Lies on the floor  PRECAUTIONS: None  WEIGHT BEARING RESTRICTIONS: No  FALLS:  Has patient fallen in last 6 months? 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    PATIENT SURVEYS:  Oswestry: 16 LEFS: 50/80= 62.5%  COGNITION: Overall cognitive status: Within functional limits for tasks assessed     SENSATION: WFL   MUSCLE LENGTH: Hamstrings: Right 60 deg; Left 70 deg Thomas test: Right pos ; Left pos   POSTURE: increased lumbar lordosis   LOWER EXTREMITY ROM:  All WFL  LOWER EXTREMITY MMT:  Generally 4 to 4+/5   SLR test:   negative Slump test:  negative  FUNCTIONAL TESTS:  5 times sit to stand: complete next visit Timed up and go (TUG): complete next visit  GAIT: Distance walked: 30 Assistive device utilized: None Level of assistance: Complete Independence Comments: slow antalgic  TODAY'S TREATMENT:     DATE: 02/01/22 Nustep x 5 min level 5 Supine bent knee lift and lowers with abdominal draw in 10x Supine green band UE pull downs 10x, with knees elevated 5x, holding arms still with marching 5x Sidelying hip abduction 20x right/left tactile cues to avoid compensatory strategies Bird dogs 10x Seated abdominal draw in with foam roll 10x Standing abdominal draw in green band rows 20x Standing abdominal draw in green band  bil shoulder extension 20x                                                                                                                             DATE: 01/28/22 Nustep x 5 min level 5 Reviewed HEP: hamstring, IT band and piriformis stretch 2 x 20 sec each PPT x 20 PPT with 90/90 heel tap x 20 (needed heavy vc's for maintaining neutral pelvis) PPT with dying bug x 20 (decreased vc's needed for neutral pelvis Hooklying clam x 20 with blue loop Side lying clam x 20 with blue loop both Ice to lumbar spine x 10 min at end of treatment  DATE: 01/27/22 Initial eval completed and initiated HEP (any treatment provided on this visit was not billed due to insurance restriction)   PATIENT EDUCATION:  Education details: Initiated HEP Person educated: Patient Education method: Consulting civil engineer, Media planner, Corporate treasurer cues, Verbal cues, and Handouts Education comprehension: verbalized understanding, returned demonstration, and verbal cues required  HOME EXERCISE PROGRAM: Access Code: [email or web address] URL: [email or web address] Date: 02/01/2022 Prepared by: Ruben Im  Exercises - Supine Hamstring Stretch with Strap  - 1 x daily - 7 x weekly - 1 sets - 3 reps - 30 sec hold - Standing Hamstring Stretch on Chair  - 1 x daily - 7 x weekly - 1 sets - 3 reps - 30 sec hold - Standing Quad Stretch with Table and Chair Support  - 1 x daily - 7 x weekly - 1 sets - 3 reps - 30 sec hold - Supine ITB Stretch with Strap  - 1 x daily - 7 x weekly - 1 sets - 3 reps - 30 sec hold - Supine Figure 4 Piriformis Stretch  - 1 x daily - 7 x weekly - 1 sets - 3 reps - 30 sec hold - Supine Piriformis Stretch with Foot on Ground  - 1 x daily - 7 x weekly - 3 sets - 10 reps - Supine Posterior Pelvic Tilt  - 1 x daily - 7 x weekly - 1 sets - 20 reps - Supine 90/90 Alternating Heel Touches with Posterior Pelvic Tilt  - 1 x daily - 7 x weekly - 1 sets - 20 reps - Supine Dead Bug with Leg Extension  - 1 x daily -  7 x weekly - 1 sets - 20 reps - Bird Dog  - 1 x daily - 7 x weekly - 1 sets - 10 reps - Sidelying Hip Abduction  - 1 x  daily - 7 x weekly - 1 sets - 10 reps - Standing Bilateral Low Shoulder Row with Anchored Resistance  - 1 x daily - 7 x weekly - 3 sets - 10 reps - Shoulder extension with resistance - Neutral  - 1 x daily - 7 x weekly - 3 sets - 10 reps Access Code: [email or web address] URL: [email or web address] Date: 01/27/2022 Prepared by: Candyce Churn  Exercises - Supine Hamstring Stretch with Strap  - 1 x daily - 7 x weekly - 1 sets - 3 reps - 30 sec hold - Standing Hamstring Stretch on Chair  - 1 x daily - 7 x weekly - 1 sets - 3 reps - 30 sec hold - Standing Quad Stretch with Table and Chair Support  - 1 x daily - 7 x weekly - 1 sets - 3 reps - 30 sec hold - Supine ITB Stretch with Strap  - 1 x daily - 7 x weekly - 1 sets - 3 reps - 30 sec hold - Supine Figure 4 Piriformis Stretch  - 1 x daily - 7 x weekly - 1 sets - 3 reps - 30 sec hold  ASSESSMENT:  CLINICAL IMPRESSION: The patient reports she is more comfortable pain-wise in standing vs lying down positions.  Verbal cues to avoid compensatory holding of her breath with abdominal strengthening ex's.  Noted left sided tilt/drop with shifting weight on left with bird dogs.   Pain level remains low throughout session.   OBJECTIVE IMPAIRMENTS: difficulty walking, decreased strength, increased fascial restrictions, increased muscle spasms, impaired flexibility, and pain.   ACTIVITY LIMITATIONS: carrying, lifting, bending, sitting, standing, squatting, sleeping, stairs, transfers, bed mobility, and dressing  PARTICIPATION LIMITATIONS: meal prep, cleaning, laundry, interpersonal relationship, driving, shopping, community activity, occupation, and yard work  PERSONAL FACTORS: Behavior pattern, Education, Fitness, and Past/current experiences are also affecting patient's functional outcome.   REHAB POTENTIAL: Good  CLINICAL  DECISION MAKING: Stable/uncomplicated  EVALUATION COMPLEXITY: Low   GOALS: Goals reviewed with patient? Yes  SHORT TERM GOALS: Target date: 02/24/2022   Pain report to be no greater than 4/10  Baseline: Goal status: INITIAL  2.  Patient will be independent with initial HEP  Baseline:  Goal status: INITIAL   LONG TERM GOALS: Target date: 03/24/2022    Patient to report pain no greater than 2/10  Baseline:  Goal status: INITIAL  2.  Patient to be independent with advanced HEP  Baseline:  Goal status: INITIAL  3.  Patient to report 85% improvement in overall symptoms  Baseline:  Goal status: INITIAL  4.  LEFS score to improve by 10 points Baseline:  Goal status: INITIAL  5.  Patient to begin regular exercise routine Baseline:  Goal status: INITIAL   PLAN:  PT FREQUENCY: 1-2x/week  PT DURATION: 8 weeks  PLANNED INTERVENTIONS: Therapeutic exercises, Therapeutic activity, Neuromuscular re-education, Balance training, Gait training, Patient/Family education, Self Care, Joint mobilization, Stair training, DME instructions, Aquatic Therapy, Dry Needling, Electrical stimulation, Spinal mobilization, Cryotherapy, Moist heat, Taping, Vasopneumatic device, Traction, Manual therapy, and Re-evaluation  PLAN FOR NEXT SESSION: Progress core strength and bilateral LE strength in standing position;  DN if patient consents and this is indicated based on symptoms.     Ruben Im, PT 02/01/22 10:03 AM Phone: [phone] Fax: [phone]  [facility] [address] [place], [place] [address] Phone # [phone] Fax [phone]

## 2022-02-08 ENCOUNTER — Ambulatory Visit: Payer: Medicaid Other

## 2022-02-08 DIAGNOSIS — M7918 Myalgia, other site: Secondary | ICD-10-CM | POA: Diagnosis not present

## 2022-02-08 DIAGNOSIS — M6281 Muscle weakness (generalized): Secondary | ICD-10-CM

## 2022-02-08 DIAGNOSIS — R252 Cramp and spasm: Secondary | ICD-10-CM

## 2022-02-08 DIAGNOSIS — R262 Difficulty in walking, not elsewhere classified: Secondary | ICD-10-CM

## 2022-02-08 NOTE — Therapy (Signed)
OUTPATIENT PHYSICAL THERAPY LOWER EXTREMITY TREATMENT NOTE   Patient Name: Connie Medina MRN: [id number] DOB:[birth year], 32 y.o., female Today's Date: 02/08/2022  END OF SESSION:  PT End of Session - 02/08/22 0850     Visit Number 4    Number of Visits 27    Date for PT Re-Evaluation 03/24/22    Authorization Type [place] MEDICAID HEALTHY BLUE    Authorization - Visit Number 4    Authorization - Number of Visits 27    Progress Note Due on Visit 10    PT Start Time 0848    PT Stop Time 0930    PT Time Calculation (min) 42 min    Activity Tolerance Patient tolerated treatment well    Behavior During Therapy WFL for tasks assessed/performed               Past Medical History:  Diagnosis Date   Infection    UTI   Migraines    Past Surgical History:  Procedure Laterality Date   CESAREAN SECTION N/A 02/13/2019   Procedure: CESAREAN SECTION;  Surgeon: Myna Hidalgo, DO;  Location: [facility];  Service: Obstetrics;  Laterality: N/A;   TONSILLECTOMY AND ADENOIDECTOMY Bilateral 09/30/2014   Procedure: BILATERAL TONSILLECTOMY AND ADENOIDECTOMY;  Surgeon: Newman Pies, MD;  Location: [facility];  Service: ENT;  Laterality: Bilateral;   Patient Active Problem List   Diagnosis Date Noted   Cesarean delivery delivered 02/16/2019   Postpartum hemorrhage 02/16/2019   Normal labor 02/13/2019    PCP: no PCP per patient  REFERRING PROVIDER: Callie Fielding, MD  REFERRING DIAG: M79.18 (ICD-10-CM) - Myalgia, other site  THERAPY DIAG:  Myalgia, other site  Muscle weakness (generalized)  Cramp and spasm  Difficulty in walking, not elsewhere classified  Rationale for Evaluation and Treatment: Rehabilitation  ONSET DATE: 01/27/2022  SUBJECTIVE:   SUBJECTIVE STATEMENT: Patient reports "last visit I did fine while I was here and felt really good but had a lot of soreness once I got home and throughout the evening"  Adds that she feels good today.  Reports 0/10 pain upon  arrival.   PERTINENT HISTORY: none PAIN:  Are you having pain? Yes: NPRS scale: 0/10 Pain location: lumbar spine Pain description: aching  Aggravating factors: sleeping on her belly and with standing and bending too long Relieving factors: Lies on the floor  PRECAUTIONS: None  WEIGHT BEARING RESTRICTIONS: No  FALLS:  Has patient fallen in last 6 months? 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    PATIENT SURVEYS:  Oswestry: 16 LEFS: 50/80= 62.5%  COGNITION: Overall cognitive status: Within functional limits for tasks assessed     SENSATION: WFL   MUSCLE LENGTH: Hamstrings: Right 60 deg; Left 70 deg Thomas test: Right pos ; Left pos   POSTURE: increased lumbar lordosis   LOWER EXTREMITY ROM:  All WFL  LOWER EXTREMITY MMT:  Generally 4 to 4+/5  SLR test:   negative Slump test:  negative  FUNCTIONAL TESTS:  5 times sit to stand: complete next visit Timed up and go (TUG): complete next visit  GAIT: Distance walked: 30 Assistive device utilized: None Level of assistance: Complete Independence Comments: slow antalgic   TODAY'S TREATMENT:     DATE: 02/08/22 Nustep x 5 min level 3 Reviewed HEP: hamstring, IT band and piriformis stretch 2 x 30 sec each PPT x 20 PPT with 90/90 heel tap x 20 (minimal vc's for maintaining neutral pelvis) PPT  with dying bug x 20 (minimal vc's needed for neutral pelvis Hooklying clam with PPT x 20 with blue loop Side lying clam x 20 with blue loop both Hooklying trunk rotation x 20 Hooklying bridge with clam x 10 Plank on elbows x 5 holding 10 sec each Ice to lumbar spine x 10 min at end of treatment  DATE: 02/01/22 Nustep x 5 min level 5 Supine bent knee lift and lowers with abdominal draw in 10x Supine green band UE pull downs 10x, with knees elevated 5x, holding arms still with marching 5x Sidelying hip abduction 20x right/left tactile cues to avoid compensatory strategies Bird dogs 10x Seated abdominal draw in with foam roll 10x Standing abdominal draw in green band rows 20x Standing abdominal draw in green band bil shoulder extension 20x                                                                                                                           DATE: 01/28/22 Nustep x 5 min level 5 Reviewed HEP: hamstring, IT band and piriformis stretch 2 x 20 sec each PPT x 20 PPT with 90/90 heel tap x 20 (needed heavy vc's for maintaining neutral pelvis) PPT with dying bug x 20 (decreased vc's needed for neutral pelvis Hooklying clam x 20 with blue loop Side lying clam x 20 with blue loop both Ice to lumbar spine x 10 min at end of treatment  DATE: 01/27/22 Initial eval completed and initiated HEP (any treatment provided on this visit was not billed due to insurance restriction)   PATIENT EDUCATION:  Education details: Initiated HEP Person educated: Patient Education method: Consulting civil engineer, Media planner, Corporate treasurer cues, Verbal cues, and Handouts Education comprehension: verbalized understanding, returned demonstration, and verbal cues required  HOME EXERCISE PROGRAM: Access Code: [email or web address] URL: [email or web address] Date: 02/01/2022 Prepared by: Ruben Im  Exercises - Supine Hamstring Stretch with Strap  - 1 x daily - 7 x weekly - 1 sets - 3 reps - 30 sec hold -  Standing Hamstring Stretch on Chair  - 1 x daily - 7 x weekly - 1 sets - 3 reps - 30 sec hold - Standing Quad Stretch with Table and Chair Support  - 1 x daily - 7 x weekly - 1 sets - 3 reps - 30 sec hold - Supine ITB Stretch with Strap  - 1 x daily - 7 x weekly - 1 sets - 3 reps -  30 sec hold - Supine Figure 4 Piriformis Stretch  - 1 x daily - 7 x weekly - 1 sets - 3 reps - 30 sec hold - Supine Piriformis Stretch with Foot on Ground  - 1 x daily - 7 x weekly - 3 sets - 10 reps - Supine Posterior Pelvic Tilt  - 1 x daily - 7 x weekly - 1 sets - 20 reps - Supine 90/90 Alternating Heel Touches with Posterior Pelvic Tilt  - 1 x daily - 7 x weekly - 1 sets - 20 reps - Supine Dead Bug with Leg Extension  - 1 x daily - 7 x weekly - 1 sets - 20 reps - Bird Dog  - 1 x daily - 7 x weekly - 1 sets - 10 reps - Sidelying Hip Abduction  - 1 x daily - 7 x weekly - 1 sets - 10 reps - Standing Bilateral Low Shoulder Row with Anchored Resistance  - 1 x daily - 7 x weekly - 3 sets - 10 reps - Shoulder extension with resistance - Neutral  - 1 x daily - 7 x weekly - 3 sets - 10 reps Access Code: [email or web address] URL: [email or web address] Date: 01/27/2022 Prepared by: Mikey Kirschner  Exercises - Supine Hamstring Stretch with Strap  - 1 x daily - 7 x weekly - 1 sets - 3 reps - 30 sec hold - Standing Hamstring Stretch on Chair  - 1 x daily - 7 x weekly - 1 sets - 3 reps - 30 sec hold - Standing Quad Stretch with Table and Chair Support  - 1 x daily - 7 x weekly - 1 sets - 3 reps - 30 sec hold - Supine ITB Stretch with Strap  - 1 x daily - 7 x weekly - 1 sets - 3 reps - 30 sec hold - Supine Figure 4 Piriformis Stretch  - 1 x daily - 7 x weekly - 1 sets - 3 reps - 30 sec hold  ASSESSMENT:  CLINICAL IMPRESSION: Vivianna tolerated session without any increased pain.  Very good core control throughout all exercises.  She should continue to do well.  She would benefit from continued skilled PT to address core  strength and flexibility.     OBJECTIVE IMPAIRMENTS: difficulty walking, decreased strength, increased fascial restrictions, increased muscle spasms, impaired flexibility, and pain.   ACTIVITY LIMITATIONS: carrying, lifting, bending, sitting, standing, squatting, sleeping, stairs, transfers, bed mobility, and dressing  PARTICIPATION LIMITATIONS: meal prep, cleaning, laundry, interpersonal relationship, driving, shopping, community activity, occupation, and yard work  PERSONAL FACTORS: Behavior pattern, Education, Fitness, and Past/current experiences are also affecting patient's functional outcome.   REHAB POTENTIAL: Good  CLINICAL DECISION MAKING: Stable/uncomplicated  EVALUATION COMPLEXITY: Low   GOALS: Goals reviewed with patient? Yes  SHORT TERM GOALS: Target date: 02/24/2022   Pain report to be no greater than 4/10  Baseline: Goal status: INITIAL  2.  Patient will be independent with initial HEP  Baseline:  Goal status: INITIAL   LONG TERM GOALS: Target date: 03/24/2022    Patient to report pain no greater than 2/10  Baseline:  Goal status: INITIAL  2.  Patient to be independent with advanced HEP  Baseline:  Goal status: INITIAL  3.  Patient to report 85% improvement in overall symptoms  Baseline:  Goal status: INITIAL  4.  LEFS score to improve by 10 points Baseline:  Goal status: INITIAL  5.  Patient to begin regular exercise routine Baseline:  Goal status:  INITIAL   PLAN:  PT FREQUENCY: 1-2x/week  PT DURATION: 8 weeks  PLANNED INTERVENTIONS: Therapeutic exercises, Therapeutic activity, Neuromuscular re-education, Balance training, Gait training, Patient/Family education, Self Care, Joint mobilization, Stair training, DME instructions, Aquatic Therapy, Dry Needling, Electrical stimulation, Spinal mobilization, Cryotherapy, Moist heat, Taping, Vasopneumatic device, Traction, Manual therapy, and Re-evaluation  PLAN FOR NEXT SESSION: Progress core  strength and bilateral LE strength;  DN if patient consents and this is indicated based on symptoms.     Victorino Dike B. Minah Axelrod, PT 02/08/22 9:28 AM  [facility] [address] [place], [place] [address] Phone # [phone] Fax [phone]

## 2022-02-10 ENCOUNTER — Ambulatory Visit: Payer: Medicaid Other

## 2022-02-10 DIAGNOSIS — R252 Cramp and spasm: Secondary | ICD-10-CM

## 2022-02-10 DIAGNOSIS — M7918 Myalgia, other site: Secondary | ICD-10-CM | POA: Diagnosis not present

## 2022-02-10 DIAGNOSIS — M6281 Muscle weakness (generalized): Secondary | ICD-10-CM

## 2022-02-10 DIAGNOSIS — R262 Difficulty in walking, not elsewhere classified: Secondary | ICD-10-CM

## 2022-02-10 NOTE — Therapy (Signed)
OUTPATIENT PHYSICAL THERAPY LOWER EXTREMITY TREATMENT NOTE   Patient Name: Connie Medina MRN: [id number] DOB:[birth year], 32 y.o., female Today's Date: 02/10/2022  END OF SESSION:  PT End of Session - 02/10/22 0900     Visit Number 5    Number of Visits 27    Date for PT Re-Evaluation 03/24/22    Authorization Type [facility] MEDICAID HEALTHY BLUE    Authorization - Visit Number 5    Authorization - Number of Visits 27    Progress Note Due on Visit 10    PT Start Time 0855    PT Stop Time 0930    PT Time Calculation (min) 35 min    Activity Tolerance Patient tolerated treatment well    Behavior During Therapy WFL for tasks assessed/performed               Past Medical History:  Diagnosis Date   Infection    UTI   Migraines    Past Surgical History:  Procedure Laterality Date   CESAREAN SECTION N/A 02/13/2019   Procedure: CESAREAN SECTION;  Surgeon: Janyth Pupa, DO;  Location: [facility];  Service: Obstetrics;  Laterality: N/A;   TONSILLECTOMY AND ADENOIDECTOMY Bilateral 09/30/2014   Procedure: BILATERAL TONSILLECTOMY AND ADENOIDECTOMY;  Surgeon: Leta Baptist, MD;  Location: [facility];  Service: ENT;  Laterality: Bilateral;   Patient Active Problem List   Diagnosis Date Noted   Cesarean delivery delivered 02/16/2019   Postpartum hemorrhage 02/16/2019   Normal labor 02/13/2019    PCP: no PCP per patient  REFERRING PROVIDER: Marlaine Hind, MD  REFERRING DIAG: M79.18 (ICD-10-CM) - Myalgia, other site  THERAPY DIAG:  Myalgia, other site  Muscle weakness (generalized)  Cramp and spasm  Difficulty in walking, not elsewhere classified  Rationale for Evaluation and Treatment: Rehabilitation  ONSET DATE: 01/27/2022  SUBJECTIVE:   SUBJECTIVE STATEMENT: Patient reports "a little sore after last visit".  Reports 0/10 pain upon arrival.   PERTINENT HISTORY: none PAIN:  Are you having pain? Yes: NPRS scale: 0/10 Pain location: lumbar spine Pain  description: aching  Aggravating factors: sleeping on her belly and with standing and bending too long Relieving factors: Lies on the floor  PRECAUTIONS: None  WEIGHT BEARING RESTRICTIONS: No  FALLS:  Has patient fallen in last 6 months? 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    PATIENT SURVEYS:  Oswestry: 16 LEFS: 50/80= 62.5%  COGNITION: Overall cognitive status: Within functional limits for tasks assessed     SENSATION: WFL   MUSCLE LENGTH: Hamstrings: Right 60 deg; Left 70 deg Thomas test: Right pos ; Left pos   POSTURE: increased lumbar lordosis   LOWER EXTREMITY ROM:  All WFL  LOWER EXTREMITY MMT:  Generally 4 to 4+/5   SLR test:   negative Slump test:  negative  FUNCTIONAL TESTS:  5 times sit to stand: complete next visit Timed up and go (TUG): complete next  visit  GAIT: Distance walked: 30 Assistive device utilized: None Level of assistance: Complete Independence Comments: slow antalgic   TODAY'S TREATMENT:     DATE: 02/10/22 Nustep x 5 min level 5 Supine hamstring, IT band and piriformis stretch 2 x 20 sec each PPT x 20 PPT with 90/90 heel tap x 20 (no vc's for maintaining neutral pelvis) PPT with dying bug x 20 (no vc's needed for neutral pelvis) Hooklying clam with PPT x 20 with blue loop Side lying clam x 20 with blue loop  both Hooklying trunk rotation x 20 Hooklying bridge with clam x 10 Plank on elbows x 3 holding 10 sec each Ice to lumbar spine x 10 min at end of treatment  DATE: 02/08/22 Nustep x 5 min level 3 Reviewed HEP: hamstring, IT band and piriformis stretch 2 x 30 sec each PPT x 20 PPT with 90/90 heel tap x 20 (minimal vc's for maintaining neutral pelvis) PPT with dying bug x 20 (minimal vc's needed for neutral pelvis Hooklying clam with PPT x 20 with blue loop Side lying clam x 20 with blue loop both Hooklying trunk rotation x 20 Hooklying bridge with clam x 10 Plank on elbows x 5 holding 10 sec each Ice to lumbar spine x 10 min at end of treatment  DATE: 02/01/22 Nustep x 5 min level 5 Supine bent knee lift and lowers with abdominal draw in 10x Supine green band UE pull downs 10x, with knees elevated 5x, holding arms still with marching 5x Sidelying hip abduction 20x right/left tactile cues to avoid compensatory strategies Bird dogs 10x Seated abdominal draw in with foam roll 10x Standing abdominal draw in green band rows 20x Standing abdominal draw in green band bil shoulder extension 20x                                                                                                                           DATE: 01/28/22 Nustep x 5 min level 5 Reviewed HEP: hamstring, IT band and piriformis stretch 2 x 20 sec each PPT x 20 PPT with 90/90 heel tap x 20 (needed heavy vc's for maintaining neutral pelvis) PPT with dying bug x 20 (decreased vc's needed for neutral pelvis Hooklying clam x 20 with blue loop Side lying clam x 20 with blue loop both Ice to lumbar spine x 10 min at end of treatment  DATE: 01/27/22 Initial eval completed and initiated HEP (any treatment provided on this visit was not billed due to insurance restriction)   PATIENT EDUCATION:  Education details: Initiated HEP Person educated: Patient Education method: Programmer, multimedia, Facilities manager, Actor cues, Verbal cues,  and Handouts Education comprehension: verbalized understanding, returned demonstration, and verbal cues required  HOME EXERCISE PROGRAM: Access Code: [email or web address] URL: [email or web address] Date: 02/01/2022 Prepared by: Lavinia Sharps  Exercises - Supine Hamstring Stretch with Strap  - 1 x daily - 7 x weekly - 1 sets - 3 reps - 30 sec hold - Standing Hamstring Stretch  on Chair  - 1 x daily - 7 x weekly - 1 sets - 3 reps - 30 sec hold - Standing Quad Stretch with Table and Chair Support  - 1 x daily - 7 x weekly - 1 sets - 3 reps - 30 sec hold - Supine ITB Stretch with Strap  - 1 x daily - 7 x weekly - 1 sets - 3 reps - 30 sec hold - Supine Figure 4 Piriformis Stretch  - 1 x daily - 7 x weekly - 1 sets - 3 reps - 30 sec hold - Supine Piriformis Stretch with Foot on Ground  - 1 x daily - 7 x weekly - 3 sets - 10 reps - Supine Posterior Pelvic Tilt  - 1 x daily - 7 x weekly - 1 sets - 20 reps - Supine 90/90 Alternating Heel Touches with Posterior Pelvic Tilt  - 1 x daily - 7 x weekly - 1 sets - 20 reps - Supine Dead Bug with Leg Extension  - 1 x daily - 7 x weekly - 1 sets - 20 reps - Bird Dog  - 1 x daily - 7 x weekly - 1 sets - 10 reps - Sidelying Hip Abduction  - 1 x daily - 7 x weekly - 1 sets - 10 reps - Standing Bilateral Low Shoulder Row with Anchored Resistance  - 1 x daily - 7 x weekly - 3 sets - 10 reps - Shoulder extension with resistance - Neutral  - 1 x daily - 7 x weekly - 3 sets - 10 reps Access Code: [email or web address] URL: [email or web address] Date: 01/27/2022 Prepared by: Candyce Churn  Exercises - Supine Hamstring Stretch with Strap  - 1 x daily - 7 x weekly - 1 sets - 3 reps - 30 sec hold - Standing Hamstring Stretch on Chair  - 1 x daily - 7 x weekly - 1 sets - 3 reps - 30 sec hold - Standing Quad Stretch with Table and Chair Support  - 1 x daily - 7 x weekly - 1 sets - 3 reps - 30 sec hold - Supine ITB Stretch with Strap  - 1 x daily - 7 x weekly - 1  sets - 3 reps - 30 sec hold - Supine Figure 4 Piriformis Stretch  - 1 x daily - 7 x weekly - 1 sets - 3 reps - 30 sec hold  ASSESSMENT:  CLINICAL IMPRESSION: Connie Medina was approx 10 min late for appt.  She tolerated session without any increased pain.  Continued good core control throughout all core strengthening exercises.  She should continue to do well.  She would benefit from continued skilled PT to address core strength and flexibility.     OBJECTIVE IMPAIRMENTS: difficulty walking, decreased strength, increased fascial restrictions, increased muscle spasms, impaired flexibility, and pain.   ACTIVITY LIMITATIONS: carrying, lifting, bending, sitting, standing, squatting, sleeping, stairs, transfers, bed mobility, and dressing  PARTICIPATION LIMITATIONS: meal prep, cleaning, laundry, interpersonal relationship, driving, shopping, community activity, occupation, and yard work  PERSONAL FACTORS: Behavior pattern, Education, Fitness, and Past/current experiences are also affecting patient's functional outcome.   REHAB POTENTIAL: Good  CLINICAL DECISION MAKING: Stable/uncomplicated  EVALUATION COMPLEXITY: Low   GOALS: Goals reviewed with patient? Yes  SHORT TERM GOALS: Target date: 02/24/2022   Pain report to be no greater than 4/10  Baseline: Goal status: MET  2.  Patient will be independent with initial HEP  Baseline:  Goal status: INITIAL   LONG TERM  GOALS: Target date: 03/24/2022    Patient to report pain no greater than 2/10  Baseline:  Goal status: INITIAL  2.  Patient to be independent with advanced HEP  Baseline:  Goal status: INITIAL  3.  Patient to report 85% improvement in overall symptoms  Baseline:  Goal status: INITIAL  4.  LEFS score to improve by 10 points Baseline:  Goal status: INITIAL  5.  Patient to begin regular exercise routine Baseline:  Goal status: INITIAL   PLAN:  PT FREQUENCY: 1-2x/week  PT DURATION: 8 weeks  PLANNED  INTERVENTIONS: Therapeutic exercises, Therapeutic activity, Neuromuscular re-education, Balance training, Gait training, Patient/Family education, Self Care, Joint mobilization, Stair training, DME instructions, Aquatic Therapy, Dry Needling, Electrical stimulation, Spinal mobilization, Cryotherapy, Moist heat, Taping, Vasopneumatic device, Traction, Manual therapy, and Re-evaluation  PLAN FOR NEXT SESSION: Progress core strength and bilateral LE strength;  DN if patient consents and this is indicated based on symptoms.     Victorino Dike B. Lamesha Tibbits, PT 02/10/22 9:21 AM  [facility] [address], [address] [place], [place] [address] Phone # [phone] Fax [phone]

## 2022-02-15 ENCOUNTER — Ambulatory Visit: Payer: Medicaid Other

## 2022-02-15 DIAGNOSIS — M7918 Myalgia, other site: Secondary | ICD-10-CM

## 2022-02-15 DIAGNOSIS — R252 Cramp and spasm: Secondary | ICD-10-CM

## 2022-02-15 DIAGNOSIS — R262 Difficulty in walking, not elsewhere classified: Secondary | ICD-10-CM

## 2022-02-15 DIAGNOSIS — M6281 Muscle weakness (generalized): Secondary | ICD-10-CM

## 2022-02-15 NOTE — Therapy (Signed)
OUTPATIENT PHYSICAL THERAPY LOWER EXTREMITY TREATMENT NOTE   Patient Name: Connie Medina MRN: [id number] DOB:Jun 24, 1990, 32 y.o., female Today's Date: 02/15/2022  END OF SESSION:  PT End of Session - 02/15/22 1624     Visit Number 6    Number of Visits 27    Date for PT Re-Evaluation 03/24/22    Authorization Type [place] MEDICAID HEALTHY BLUE    Authorization - Number of Visits 27    Progress Note Due on Visit 10    PT Start Time 1941    PT Stop Time 1700    PT Time Calculation (min) 45 min    Activity Tolerance Patient tolerated treatment well    Behavior During Therapy WFL for tasks assessed/performed               Past Medical History:  Diagnosis Date   Infection    UTI   Migraines    Past Surgical History:  Procedure Laterality Date   CESAREAN SECTION N/A 02/13/2019   Procedure: CESAREAN SECTION;  Surgeon: Janyth Pupa, DO;  Location: [facility];  Service: Obstetrics;  Laterality: N/A;   TONSILLECTOMY AND ADENOIDECTOMY Bilateral 09/30/2014   Procedure: BILATERAL TONSILLECTOMY AND ADENOIDECTOMY;  Surgeon: Leta Baptist, MD;  Location: [facility];  Service: ENT;  Laterality: Bilateral;   Patient Active Problem List   Diagnosis Date Noted   Cesarean delivery delivered 02/16/2019   Postpartum hemorrhage 02/16/2019   Normal labor 02/13/2019    PCP: no PCP per patient  REFERRING PROVIDER: Marlaine Hind, MD  REFERRING DIAG: M79.18 (ICD-10-CM) - Myalgia, other site  THERAPY DIAG:  Myalgia, other site  Muscle weakness (generalized)  Cramp and spasm  Difficulty in walking, not elsewhere classified  Rationale for Evaluation and Treatment: Rehabilitation  ONSET DATE: 01/27/2022  SUBJECTIVE:   SUBJECTIVE STATEMENT: Patient reports "a little sore the last few days.  I do really well once I leave her and for the first few days after a session but I start to get sore again after that".  Reports 0/10 pain upon arrival.   PERTINENT HISTORY: none PAIN:   Are you having pain? Yes: NPRS scale: 0/10 Pain location: lumbar spine Pain description: aching  Aggravating factors: sleeping on her belly and with standing and bending too long Relieving factors: Lies on the floor  PRECAUTIONS: None  WEIGHT BEARING RESTRICTIONS: No  FALLS:  Has patient fallen in last 6 months? 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    PATIENT SURVEYS:  Oswestry: 16 LEFS: 50/80= 62.5%  COGNITION: Overall cognitive status: Within functional limits for tasks assessed     SENSATION: WFL   MUSCLE LENGTH: Hamstrings: Right 60 deg; Left 70 deg Thomas test: Right pos ; Left pos   POSTURE: increased lumbar lordosis   LOWER EXTREMITY ROM:  All WFL  LOWER EXTREMITY MMT:  Generally 4 to 4+/5   SLR test:   negative Slump test:  negative  FUNCTIONAL TESTS:  5 times sit to stand: complete next visit Timed up and go (TUG): complete next visit  GAIT: Distance walked: 30 Assistive device utilized: None Level of assistance: Complete Independence Comments: slow antalgic   TODAY'S TREATMENT:     DATE: 02/15/22 Nustep x 5 min level 5 Supine hamstring, IT band and piriformis stretch 2 x 20 sec each PPT x 20 Hooklying clam with PPT x 20 with blue loop PPT with 90/90 heel tap x 20 (no vc's for maintaining neutral pelvis) PPT with dying bug  x 20 (no vc's needed for neutral pelvis) Hooklying trunk rotation x 20 PPT with shoulder flexion to SLR with yellow plyo ball x 20 each side Seated in modified hollow hold, Russian twist with yellow plyo ball Star crunch x 10 each side  Ice to lumbar spine x 10 min at end of treatment  DATE: 02/10/22 Nustep x 5 min level 5 Supine hamstring, IT band and piriformis stretch 2 x 20 sec each PPT x 20 PPT with 90/90 heel tap x 20 (no vc's for maintaining neutral pelvis) PPT with dying bug x 20 (no vc's needed for neutral pelvis) Hooklying clam with PPT x 20 with blue loop Side lying clam x 20 with blue loop both Hooklying trunk rotation x 20 Hooklying bridge with clam x 10 Plank on elbows x 3 holding 10 sec each Ice to lumbar spine x 10 min at end of treatment  DATE: 02/08/22 Nustep x 5 min level 3 Reviewed HEP: hamstring, IT band and piriformis stretch 2 x 30 sec each PPT x 20 PPT with 90/90 heel tap x 20 (minimal vc's for maintaining neutral pelvis) PPT with dying bug x 20 (minimal vc's needed for neutral pelvis Hooklying clam with PPT x 20 with blue loop Side lying clam x 20 with blue loop both Hooklying trunk rotation x 20 Hooklying bridge with clam x 10 Plank on elbows x 5 holding 10 sec each Ice to lumbar spine x 10 min at end of treatment   PATIENT EDUCATION:  Education details: Initiated HEP Person educated: Patient Education method: Consulting civil engineer, Media planner, Corporate treasurer cues, Verbal cues, and Handouts Education comprehension: verbalized understanding, returned demonstration, and verbal cues required  HOME EXERCISE PROGRAM: Access Code: [email or web address] URL: [email or web address] Date: 02/01/2022 Prepared by: Ruben Im  Exercises - Supine Hamstring Stretch with Strap  - 1 x daily - 7 x weekly - 1 sets - 3 reps - 30 sec hold - Standing Hamstring Stretch on Chair  - 1 x daily - 7 x weekly - 1 sets - 3 reps - 30 sec hold - Standing Quad Stretch with Table and  Chair Support  - 1 x daily - 7 x weekly - 1 sets - 3 reps - 30 sec hold - Supine ITB Stretch with Strap  - 1 x daily - 7 x weekly - 1 sets - 3 reps - 30 sec hold - Supine Figure 4 Piriformis Stretch  - 1 x daily - 7 x weekly - 1 sets - 3 reps - 30 sec hold - Supine Piriformis Stretch with Foot on Ground  - 1 x daily - 7 x weekly - 3 sets - 10 reps - Supine Posterior Pelvic Tilt  - 1 x daily - 7 x weekly - 1 sets - 20 reps - Supine 90/90 Alternating Heel Touches with Posterior Pelvic Tilt  - 1 x daily - 7 x weekly - 1 sets - 20 reps - Supine Dead Bug with  Leg Extension  - 1 x daily - 7 x weekly - 1 sets - 20 reps - Bird Dog  - 1 x daily - 7 x weekly - 1 sets - 10 reps - Sidelying Hip Abduction  - 1 x daily - 7 x weekly - 1 sets - 10 reps - Standing Bilateral Low Shoulder Row with Anchored Resistance  - 1 x daily - 7 x weekly - 3 sets - 10 reps - Shoulder extension with resistance - Neutral  - 1 x daily - 7 x weekly - 3 sets - 10 reps Access Code: [email or web address] URL: [email or web address] Date: 01/27/2022 Prepared by: Mikey Kirschner  Exercises - Supine Hamstring Stretch with Strap  - 1 x daily - 7 x weekly - 1 sets - 3 reps - 30 sec hold - Standing Hamstring Stretch on Chair  - 1 x daily - 7 x weekly - 1 sets - 3 reps - 30 sec hold - Standing Quad Stretch with Table and Chair Support  - 1 x daily - 7 x weekly - 1 sets - 3 reps - 30 sec hold - Supine ITB Stretch with Strap  - 1 x daily - 7 x weekly - 1 sets - 3 reps - 30 sec hold - Supine Figure 4 Piriformis Stretch  - 1 x daily - 7 x weekly - 1 sets - 3 reps - 30 sec hold  ASSESSMENT:  CLINICAL IMPRESSION: Connie Medina is progressing appropriately.  She was able to tolerate higher level core exercises such as [place] twist and star crunch without pain.   She should continue to do well.  She would benefit from continued skilled PT to address core strength and flexibility.     OBJECTIVE IMPAIRMENTS: difficulty walking, decreased strength,  increased fascial restrictions, increased muscle spasms, impaired flexibility, and pain.   ACTIVITY LIMITATIONS: carrying, lifting, bending, sitting, standing, squatting, sleeping, stairs, transfers, bed mobility, and dressing  PARTICIPATION LIMITATIONS: meal prep, cleaning, laundry, interpersonal relationship, driving, shopping, community activity, occupation, and yard work  PERSONAL FACTORS: Behavior pattern, Education, Fitness, and Past/current experiences are also affecting patient's functional outcome.   REHAB POTENTIAL: Good  CLINICAL DECISION MAKING: Stable/uncomplicated  EVALUATION COMPLEXITY: Low   GOALS: Goals reviewed with patient? Yes  SHORT TERM GOALS: Target date: 02/24/2022   Pain report to be no greater than 4/10  Baseline: Goal status: MET  2.  Patient will be independent with initial HEP  Baseline:  Goal status: INITIAL   LONG TERM GOALS: Target date: 03/24/2022    Patient to report pain no greater than 2/10  Baseline:  Goal status: INITIAL  2.  Patient to be independent with advanced HEP  Baseline:  Goal status: INITIAL  3.  Patient to report 85% improvement in overall symptoms  Baseline:  Goal status: INITIAL  4.  LEFS score to improve by 10 points Baseline:  Goal status: INITIAL  5.  Patient to begin regular exercise routine Baseline:  Goal status: INITIAL   PLAN:  PT FREQUENCY: 1-2x/week  PT DURATION: 8 weeks  PLANNED INTERVENTIONS: Therapeutic exercises, Therapeutic activity, Neuromuscular re-education, Balance training, Gait training, Patient/Family education, Self Care, Joint mobilization, Stair training, DME instructions, Aquatic Therapy, Dry Needling, Electrical stimulation, Spinal mobilization, Cryotherapy, Moist heat, Taping, Vasopneumatic device, Traction, Manual therapy, and Re-evaluation  PLAN FOR NEXT SESSION: Progress core strength and bilateral LE strength;  DN if patient consents and this is indicated based on symptoms.      Victorino Dike B. Joaquim Tolen, PT 02/15/22 11:15 PM  [facility] [address], [address] [place], [place] [address] Phone # [phone] Fax [phone]

## 2022-02-17 ENCOUNTER — Ambulatory Visit: Payer: Medicaid Other | Admitting: Physical Therapy

## 2022-02-22 ENCOUNTER — Ambulatory Visit: Payer: Medicaid Other

## 2022-02-22 DIAGNOSIS — R262 Difficulty in walking, not elsewhere classified: Secondary | ICD-10-CM

## 2022-02-22 DIAGNOSIS — R252 Cramp and spasm: Secondary | ICD-10-CM

## 2022-02-22 DIAGNOSIS — M6281 Muscle weakness (generalized): Secondary | ICD-10-CM

## 2022-02-22 DIAGNOSIS — M7918 Myalgia, other site: Secondary | ICD-10-CM | POA: Diagnosis not present

## 2022-02-22 NOTE — Therapy (Addendum)
OUTPATIENT PHYSICAL THERAPY LOWER EXTREMITY TREATMENT NOTE  Progress Note Reporting Period 01/27/22 to 02/22/22  See note below for Objective Data and Assessment of Progress/Goals.     Patient Name: Connie Medina MRN: [id number] DOB:[birth year], 32 y.o., female Today's Date: 02/22/2022  END OF SESSION:  PT End of Session - 02/22/22 0937     Visit Number 7    Number of Visits 27    Date for PT Re-Evaluation 03/24/22    Authorization Type [place] MEDICAID HEALTHY BLUE    Authorization - Visit Number 7    Authorization - Number of Visits 27    Progress Note Due on Visit 10    PT Start Time 0932    PT Stop Time 1013    PT Time Calculation (min) 41 min    Activity Tolerance Patient tolerated treatment well    Behavior During Therapy WFL for tasks assessed/performed               Past Medical History:  Diagnosis Date   Infection    UTI   Migraines    Past Surgical History:  Procedure Laterality Date   CESAREAN SECTION N/A 02/13/2019   Procedure: CESAREAN SECTION;  Surgeon: Janyth Pupa, DO;  Location: [facility];  Service: Obstetrics;  Laterality: N/A;   TONSILLECTOMY AND ADENOIDECTOMY Bilateral 09/30/2014   Procedure: BILATERAL TONSILLECTOMY AND ADENOIDECTOMY;  Surgeon: Leta Baptist, MD;  Location: [facility];  Service: ENT;  Laterality: Bilateral;   Patient Active Problem List   Diagnosis Date Noted   Cesarean delivery delivered 02/16/2019   Postpartum hemorrhage 02/16/2019   Normal labor 02/13/2019    PCP: no PCP per patient  REFERRING PROVIDER: Marlaine Hind, MD  REFERRING DIAG: M79.18 (ICD-10-CM) - Myalgia, other site  THERAPY DIAG:  Myalgia, other site  Muscle weakness (generalized)  Cramp and spasm  Difficulty in walking, not elsewhere classified  Rationale for Evaluation and Treatment: Rehabilitation  ONSET DATE: 01/27/2022  SUBJECTIVE:   SUBJECTIVE STATEMENT: "No pain.  Haven't really done a whole lot at home."  No soreness from last  session reported.   PERTINENT HISTORY: none PAIN:  Are you having pain? Yes: NPRS scale: 0/10 Pain location: lumbar spine Pain description: aching  Aggravating factors: sleeping on her belly and with standing and bending too long Relieving factors: Lies on the floor  PRECAUTIONS: None  WEIGHT BEARING RESTRICTIONS: No  FALLS:  Has patient fallen in last 6 months? 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    PATIENT SURVEYS:  Oswestry: 16 02/22/22: 18 LEFS: 50/80= 62.5% 02/22/22: 45  COGNITION: Overall cognitive status: Within functional limits for tasks assessed     SENSATION: WFL   MUSCLE LENGTH: Hamstrings: Right 60 deg; Left 70 deg Thomas test: Right pos ; Left pos   POSTURE: increased lumbar lordosis   LOWER EXTREMITY ROM:  All WFL  LOWER EXTREMITY MMT:  Generally 4 to 4+/5 02/22/22:  4+/5   SLR test:   negative Slump test:  negative  FUNCTIONAL TESTS:  5 times sit to stand: 9.18 sec Timed up and go (TUG): 6.22 sec  02/22/22: 5 times sit to stand: 8.68 sec Timed up and go (TUG): 5.58 sec  GAIT: Distance walked: 30 Assistive device utilized: None Level of assistance: Complete Independence Comments: slow antalgic   TODAY'S TREATMENT:     DATE: 02/22/22 Nustep x 5 min level 5 Re-assessment Supine hamstring, IT band and piriformis  stretch 2 x 20 sec each PPT x 20 Hooklying clam with PPT x 20 with yellow loop PPT with dying bug x 20  PPT with 90/90 heel tap x 20 Hooklying trunk rotation x 20 PPT with shoulder flexion to SLR with increase to blue plyo ball x 20 each side Seated in modified hollow hold, [place] twist with increase to blue plyo ball Star crunch x 20 each side (verbal cues to engage obliques by raising upper body slightly higher) PPT with ball pass 2 x 10 with red physio ball Ice to lumbar spine x 10 min at end of treatment in hook lying  DATE: 02/15/22 Nustep x 5 min level 5 Supine hamstring, IT band and piriformis stretch 2 x 20 sec each PPT x 20 Hooklying clam with PPT x 20 with blue loop PPT with 90/90 heel tap x 20 (no vc's for maintaining neutral pelvis) PPT with dying bug x 20 (no vc's needed for neutral pelvis) Hooklying trunk rotation x 20 PPT with shoulder flexion to SLR with yellow plyo ball x 20 each side Seated in modified hollow hold, Russian twist with yellow plyo ball 2 x 10 Star crunch x 10 .  Ice to lumbar spine x 10 min at end of treatment in hook lying.   DATE: 02/10/22 Nustep x 5 min level 5 Supine hamstring, IT band and piriformis stretch 2 x 20 sec each PPT x 20 PPT with 90/90 heel tap x 20 (no vc's for maintaining neutral pelvis) PPT with dying bug x 20 (no vc's needed for neutral pelvis) Hooklying clam with PPT x 20 with blue loop Side lying clam x 20 with blue loop both Hooklying trunk rotation x 20 Hooklying bridge with clam x 10 Plank on elbows x 3 holding 10 sec each Ice to lumbar spine x 10 min at end of treatment  PATIENT EDUCATION:  Education details: Initiated HEP Person educated: Patient Education method: Programmer, multimedia, Facilities manager, Actor cues, Verbal cues, and Handouts Education comprehension: verbalized understanding, returned demonstration, and verbal cues required  HOME EXERCISE PROGRAM: Access Code: [email or web address] URL:  [email or web address] Date: 02/01/2022 Prepared by: Lavinia Sharps  Exercises - Supine Hamstring Stretch with Strap  - 1 x daily - 7 x weekly - 1 sets - 3 reps - 30 sec hold - Standing Hamstring Stretch on Chair  - 1 x daily - 7 x weekly - 1 sets - 3 reps - 30 sec hold - Standing Quad Stretch with Table and Chair Support  - 1 x daily - 7 x weekly - 1 sets - 3 reps - 30 sec hold - Supine ITB Stretch with Strap  - 1 x daily - 7 x weekly - 1 sets - 3 reps - 30 sec hold - Supine Figure 4 Piriformis Stretch  - 1 x daily - 7 x weekly - 1 sets - 3 reps - 30 sec hold - Supine Piriformis Stretch with Foot on Ground  - 1 x daily - 7 x weekly -  3 sets - 10 reps - Supine Posterior Pelvic Tilt  - 1 x daily - 7 x weekly - 1 sets - 20 reps - Supine 90/90 Alternating Heel Touches with Posterior Pelvic Tilt  - 1 x daily - 7 x weekly - 1 sets - 20 reps - Supine Dead Bug with Leg Extension  - 1 x daily - 7 x weekly - 1 sets - 20 reps - Bird Dog  - 1 x daily - 7 x weekly - 1 sets - 10 reps - Sidelying Hip Abduction  - 1 x daily - 7 x weekly - 1 sets - 10 reps - Standing Bilateral Low Shoulder Row with Anchored Resistance  - 1 x daily - 7 x weekly - 3 sets - 10 reps - Shoulder extension with resistance - Neutral  - 1 x daily - 7 x weekly - 3 sets - 10 reps ASSESSMENT:  CLINICAL IMPRESSION: Farron continues to tolerate higher level core exercises without pain.   She should continue to do well.  She would benefit from continued skilled PT to address core strength and flexibility.     OBJECTIVE IMPAIRMENTS: difficulty walking, decreased strength, increased fascial restrictions, increased muscle spasms, impaired flexibility, and pain.   ACTIVITY LIMITATIONS: carrying, lifting, bending, sitting, standing, squatting, sleeping, stairs, transfers, bed mobility, and dressing  PARTICIPATION LIMITATIONS: meal prep, cleaning, laundry, interpersonal relationship, driving, shopping, community activity,  occupation, and yard work  PERSONAL FACTORS: Behavior pattern, Education, Fitness, and Past/current experiences are also affecting patient's functional outcome.   REHAB POTENTIAL: Good  CLINICAL DECISION MAKING: Stable/uncomplicated  EVALUATION COMPLEXITY: Low   GOALS: Goals reviewed with patient? Yes  SHORT TERM GOALS: Target date: 02/24/2022   Pain report to be no greater than 4/10  Baseline: Goal status: MET  2.  Patient will be independent with initial HEP  Baseline:  Goal status: INITIAL   LONG TERM GOALS: Target date: 03/24/2022    Patient to report pain no greater than 2/10  Baseline:  Goal status: INITIAL  2.  Patient to be independent with advanced HEP  Baseline:  Goal status: INITIAL  3.  Patient to report 85% improvement in overall symptoms  Baseline:  Goal status: INITIAL  4.  LEFS score to improve by 10 points Baseline:  Goal status: INITIAL  5.  Patient to begin regular exercise routine Baseline:  Goal status: INITIAL   PLAN:  PT FREQUENCY: 1-2x/week  PT DURATION: 8 weeks  PLANNED INTERVENTIONS: Therapeutic exercises, Therapeutic activity, Neuromuscular re-education, Balance training, Gait training, Patient/Family education, Self Care, Joint mobilization, Stair training, DME instructions, Aquatic Therapy, Dry Needling, Electrical stimulation, Spinal mobilization, Cryotherapy, Moist heat, Taping, Vasopneumatic device, Traction, Manual therapy, and Re-evaluation  PLAN FOR NEXT SESSION: Progress core strength and bilateral LE strength;  DN if patient consents and this is indicated based on symptoms.     Anderson Malta B. Abdon Petrosky, PT 02/22/22 11:48 AM  [facility] [address], [place], [place] [address] Phone # [phone] Fax [phone]

## 2022-02-24 ENCOUNTER — Ambulatory Visit: Payer: Medicaid Other

## 2022-02-24 DIAGNOSIS — M6281 Muscle weakness (generalized): Secondary | ICD-10-CM

## 2022-02-24 DIAGNOSIS — M7918 Myalgia, other site: Secondary | ICD-10-CM

## 2022-02-24 DIAGNOSIS — R252 Cramp and spasm: Secondary | ICD-10-CM

## 2022-02-24 DIAGNOSIS — R262 Difficulty in walking, not elsewhere classified: Secondary | ICD-10-CM

## 2022-02-24 NOTE — Therapy (Signed)
OUTPATIENT PHYSICAL THERAPY LOWER EXTREMITY TREATMENT NOTE  Progress Note Reporting Period 01/27/22 to 02/22/22  See note below for Objective Data and Assessment of Progress/Goals.     Patient Name: Connie Medina MRN: [id number] DOB:1990/02/13, 32 y.o., female Today's Date: 02/24/2022  END OF SESSION:  PT End of Session - 02/24/22 0853     Visit Number 8    Number of Visits 12    Date for PT Re-Evaluation 04/22/22    Authorization Type [place] MEDICAID HEALTHY BLUE    Authorization Time Period [employer] approved 6 more visits 02/22/2022 - 04/22/2022 [id number]    Authorization - Visit Number 8    Authorization - Number of Visits 12    Progress Note Due on Visit 12    PT Start Time 0845    PT Stop Time 0928    PT Time Calculation (min) 43 min    Activity Tolerance Patient tolerated treatment well    Behavior During Therapy [facility] for tasks assessed/performed               Past Medical History:  Diagnosis Date   Infection    UTI   Migraines    Past Surgical History:  Procedure Laterality Date   CESAREAN SECTION N/A 02/13/2019   Procedure: CESAREAN SECTION;  Surgeon: Myna Hidalgo, DO;  Location: [facility];  Service: Obstetrics;  Laterality: N/A;   TONSILLECTOMY AND ADENOIDECTOMY Bilateral 09/30/2014   Procedure: BILATERAL TONSILLECTOMY AND ADENOIDECTOMY;  Surgeon: Newman Pies, MD;  [facility];  Service: ENT;  Laterality: Bilateral;   Patient Active Problem List   Diagnosis Date Noted   Cesarean delivery delivered 02/16/2019   Postpartum hemorrhage 02/16/2019   Normal labor 02/13/2019    PCP: no PCP per patient  REFERRING PROVIDER: Callie Fielding, MD  REFERRING DIAG: M79.18 (ICD-10-CM) - Myalgia, other site  THERAPY DIAG:  Myalgia, other site  Muscle weakness (generalized)  Cramp and spasm  Difficulty in walking, not elsewhere classified  Rationale for Evaluation and Treatment: Rehabilitation  ONSET DATE: 01/27/2022  SUBJECTIVE:    SUBJECTIVE STATEMENT: "No pain"  Patient denies any problems with her exercises.  We discuss that patient has 4 more visits and we can DC or finish out the remaining appts.  Patient feels she definitely needs those visits.    PERTINENT HISTORY: none PAIN:  Are you having pain? Yes: NPRS scale: 0/10 Pain location: lumbar spine Pain description: aching  Aggravating factors: sleeping on her belly and with standing and bending too long Relieving factors: Lies on the floor  PRECAUTIONS: None  WEIGHT BEARING RESTRICTIONS: No  FALLS:  Has patient fallen in last 6 months? 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    PATIENT SURVEYS:  Oswestry: 16 02/22/22: 18 LEFS: 50/80= 62.5% 02/22/22: 45  COGNITION: Overall cognitive status: Within functional limits for tasks assessed     SENSATION: WFL   MUSCLE LENGTH: Hamstrings: Right 60  deg; Left 70 deg Thomas test: Right pos ; Left pos   POSTURE: increased lumbar lordosis   LOWER EXTREMITY ROM:  All WFL  LOWER EXTREMITY MMT:  Generally 4 to 4+/5 02/22/22: 4+/5   SLR test:   negative Slump test:  negative  FUNCTIONAL TESTS:  5 times sit to stand: 9.18 sec Timed up and go (TUG): 6.22 sec  02/22/22: 5 times sit to stand: 8.68 sec Timed up and go (TUG): 5.58 sec  GAIT: Distance walked: 30 Assistive device  utilized: None Level of assistance: Complete Independence Comments: slow antalgic   TODAY'S TREATMENT:     DATE: 02/24/22 Nustep x 5 min level 5 Supine IT band and piriformis stretch 3 x 20 sec each PPT x 20 Hooklying clam with PPT x 20 with yellow loop Side lying clam x 20 each with yellow loop Quadruped fire hydrant x 20 bilaterally Quadruped bird dog x 20 each side Prone super man 2 x 10 PPT with dying bug x 20  Hooklying trunk rotation x 20 Hollow hold pass unders with 4 lb weight Seated in modified hollow hold, [place] twist with increase to blue plyo ball 2 x 10 Star crunch x 20 each side (verbal cues to engage obliques by raising upper body slightly higher) PPT with ball pass 2 x 10 with red physio ball Ice to lumbar spine x 10 min at end of treatment in hook lying  DATE: 02/22/22 Nustep x 5 min level 5 Re-assessment Supine hamstring, IT band and piriformis stretch 2 x 20 sec each PPT x 20 Hooklying clam with PPT x 20 with yellow loop PPT with dying bug x 20  PPT with 90/90 heel tap x 20 Hooklying trunk rotation x 20 PPT with shoulder flexion to SLR with increase to blue plyo ball x 20 each side Seated in modified hollow hold, [place] twist with increase to blue plyo ball Star crunch x 20 each side (verbal cues to engage obliques by raising upper body slightly higher) PPT with ball pass 2 x 10 with red physio ball Ice to lumbar spine x 10 min at end of treatment in hook lying  DATE: 02/15/22 Nustep x 5 min level 5 Supine hamstring, IT band and piriformis stretch 2 x 20 sec each PPT x 20 Hooklying clam with PPT x 20 with blue loop PPT with 90/90 heel tap x 20 (no vc's for maintaining neutral pelvis) PPT with dying bug x 20 (no vc's needed for neutral pelvis) Hooklying trunk rotation x 20 PPT with shoulder flexion to SLR with yellow plyo ball x 20 each side Seated in modified hollow hold, Russian twist with yellow plyo ball 2 x 10 Star crunch x 10 .  Ice to lumbar  spine x 10 min at end of treatment in hook lying.   DATE: 02/10/22 Nustep x 5 min level 5 Supine hamstring, IT band and piriformis stretch 2 x 20 sec each PPT x 20 PPT with 90/90 heel tap x 20 (no vc's for maintaining neutral pelvis) PPT with dying bug x 20 (no vc's needed for neutral pelvis) Hooklying clam with PPT x 20 with blue loop Side lying clam x 20 with blue loop both Hooklying trunk rotation x 20 Hooklying bridge with clam x 10 Plank on elbows x 3 holding 10 sec each Ice to lumbar spine x 10 min at end of treatment  PATIENT EDUCATION:  Education details: Initiated HEP Person educated: Patient Education method: Explanation, Demonstration, Tactile cues, Verbal cues, and Handouts  Education comprehension: verbalized understanding, returned demonstration, and verbal cues required  HOME EXERCISE PROGRAM: Access Code: [email or web address] URL: [email or web address] Date: 02/01/2022 Prepared by: Ruben Im  Exercises - Supine Hamstring Stretch with Strap  - 1 x daily - 7 x weekly - 1 sets - 3 reps - 30 sec hold - Standing Hamstring Stretch on Chair  - 1 x daily - 7 x weekly - 1 sets - 3 reps - 30 sec hold - Standing Quad Stretch with Table and Chair Support  - 1 x daily - 7 x weekly - 1 sets - 3 reps - 30 sec hold - Supine ITB Stretch with Strap  - 1 x daily - 7 x weekly - 1 sets - 3 reps - 30 sec hold - Supine Figure 4 Piriformis Stretch  - 1 x daily - 7 x weekly - 1 sets - 3 reps - 30 sec hold - Supine Piriformis Stretch with Foot on Ground  - 1 x daily - 7 x weekly - 3 sets - 10 reps - Supine Posterior Pelvic Tilt  - 1 x daily - 7 x weekly - 1 sets - 20 reps - Supine 90/90 Alternating Heel Touches with Posterior Pelvic Tilt  - 1 x daily - 7 x weekly - 1 sets - 20 reps - Supine Dead Bug with Leg Extension  - 1 x daily - 7 x weekly - 1 sets - 20 reps - Bird Dog  - 1 x daily - 7 x weekly - 1 sets - 10 reps - Sidelying Hip Abduction  - 1 x daily - 7 x weekly - 1 sets - 10  reps - Standing Bilateral Low Shoulder Row with Anchored Resistance  - 1 x daily - 7 x weekly - 3 sets - 10 reps - Shoulder extension with resistance - Neutral  - 1 x daily - 7 x weekly - 3 sets - 10 reps ASSESSMENT:  CLINICAL IMPRESSION: [place] is progressing very well.  She is able to do high level core exercises without pain.  She actually added pass unders in hollow hold on her own with a 4 lb weight.   She should continue to do well.  She would benefit from continued skilled PT to address core strength and flexibility.     OBJECTIVE IMPAIRMENTS: difficulty walking, decreased strength, increased fascial restrictions, increased muscle spasms, impaired flexibility, and pain.   ACTIVITY LIMITATIONS: carrying, lifting, bending, sitting, standing, squatting, sleeping, stairs, transfers, bed mobility, and dressing  PARTICIPATION LIMITATIONS: meal prep, cleaning, laundry, interpersonal relationship, driving, shopping, community activity, occupation, and yard work  PERSONAL FACTORS: Behavior pattern, Education, Fitness, and Past/current experiences are also affecting patient's functional outcome.   REHAB POTENTIAL: Good  CLINICAL DECISION MAKING: Stable/uncomplicated  EVALUATION COMPLEXITY: Low   GOALS: Goals reviewed with patient? Yes  SHORT TERM GOALS: Target date: 02/24/2022   Pain report to be no greater than 4/10  Baseline: Goal status: MET  2.  Patient will be independent with initial HEP  Baseline:  Goal status: INITIAL   LONG TERM GOALS: Target date: 03/24/2022    Patient to report pain no greater than 2/10  Baseline:  Goal status: INITIAL  2.  Patient to be independent with advanced HEP  Baseline:  Goal status: INITIAL  3.  Patient to report 85% improvement in overall symptoms  Baseline:  Goal status: INITIAL  4.  LEFS score to improve by 10 points Baseline:  Goal status: INITIAL  5.  Patient to begin regular exercise  routine Baseline:  Goal status:  INITIAL   PLAN:  PT FREQUENCY: 1-2x/week  PT DURATION: 8 weeks  PLANNED INTERVENTIONS: Therapeutic exercises, Therapeutic activity, Neuromuscular re-education, Balance training, Gait training, Patient/Family education, Self Care, Joint mobilization, Stair training, DME instructions, Aquatic Therapy, Dry Needling, Electrical stimulation, Spinal mobilization, Cryotherapy, Moist heat, Taping, Vasopneumatic device, Traction, Manual therapy, and Re-evaluation  PLAN FOR NEXT SESSION: Progress core strength and bilateral LE strength;  DN if patient consents and this is indicated based on symptoms.     Victorino Dike B. Reginae Wolfrey, PT 02/24/22 9:23 AM  [facility] [address] [place], [place] [address] Phone # [phone] Fax [phone]

## 2022-03-01 ENCOUNTER — Ambulatory Visit: Payer: Medicaid Other

## 2022-03-01 DIAGNOSIS — R262 Difficulty in walking, not elsewhere classified: Secondary | ICD-10-CM

## 2022-03-01 DIAGNOSIS — M6281 Muscle weakness (generalized): Secondary | ICD-10-CM

## 2022-03-01 DIAGNOSIS — R252 Cramp and spasm: Secondary | ICD-10-CM

## 2022-03-01 DIAGNOSIS — M7918 Myalgia, other site: Secondary | ICD-10-CM

## 2022-03-01 NOTE — Therapy (Signed)
OUTPATIENT PHYSICAL THERAPY LOWER EXTREMITY TREATMENT NOTE  Progress Note Reporting Period 01/27/22 to 02/22/22  See note below for Objective Data and Assessment of Progress/Goals.     Patient Name: Connie Medina MRN: [id number] DOB:[birth year], 32 y.o., female Today's Date: 03/01/2022  END OF SESSION:  PT End of Session - 03/01/22 1623     Visit Number 9    Number of Visits 12    Date for PT Re-Evaluation 04/22/22    Authorization Type [place] MEDICAID HEALTHY BLUE    Authorization Time Period [employer] approved 6 more visits 02/22/2022 - 04/22/2022 [id number]    Authorization - Number of Visits 9    Progress Note Due on Visit 12    PT Start Time 0630    PT Stop Time 1640    PT Time Calculation (min) 25 min    Activity Tolerance Patient tolerated treatment well    Behavior During Therapy [facility] for tasks assessed/performed               Past Medical History:  Diagnosis Date   Infection    UTI   Migraines    Past Surgical History:  Procedure Laterality Date   CESAREAN SECTION N/A 02/13/2019   Procedure: CESAREAN SECTION;  Surgeon: Janyth Pupa, DO;  Location: [place] LD ORS;  Service: Obstetrics;  Laterality: N/A;   TONSILLECTOMY AND ADENOIDECTOMY Bilateral 09/30/2014   Procedure: BILATERAL TONSILLECTOMY AND ADENOIDECTOMY;  Surgeon: Leta Baptist, MD;  Location: [place];  Service: ENT;  Laterality: Bilateral;   Patient Active Problem List   Diagnosis Date Noted   Cesarean delivery delivered 02/16/2019   Postpartum hemorrhage 02/16/2019   Normal labor 02/13/2019    PCP: no PCP per patient  REFERRING PROVIDER: Marlaine Hind, MD  REFERRING DIAG: M79.18 (ICD-10-CM) - Myalgia, other site  THERAPY DIAG:  Myalgia, other site  Muscle weakness (generalized)  Cramp and spasm  Difficulty in walking, not elsewhere classified  Rationale for Evaluation and Treatment: Rehabilitation  ONSET DATE: 01/27/2022  SUBJECTIVE:   SUBJECTIVE STATEMENT: "No pain"   Patient states she only gets pain when she is active at home caring for her family or doing chores around the house.  But that this pain has not changed.  She no longer has pain at rest.     PERTINENT HISTORY: none PAIN:  Are you having pain? Yes: NPRS scale: 0/10 Pain location: lumbar spine Pain description: aching  Aggravating factors: sleeping on her belly and with standing and bending too long Relieving factors: Lies on the floor  PRECAUTIONS: None  WEIGHT BEARING RESTRICTIONS: No  FALLS:  Has patient fallen in last 6 months? 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    PATIENT SURVEYS:  Oswestry: 16 02/22/22: 18 LEFS: 50/80= 62.5% 02/22/22: 45  COGNITION: Overall cognitive status: Within functional limits for tasks assessed     SENSATION: WFL   MUSCLE LENGTH: Hamstrings: Right 60 deg; Left 70  deg Marcello Moores test: Right pos ; Left pos   POSTURE: increased lumbar lordosis   LOWER EXTREMITY ROM:  All WFL  LOWER EXTREMITY MMT:  Generally 4 to 4+/5 02/22/22: 4+/5   SLR test:   negative Slump test:  negative  FUNCTIONAL TESTS:  5 times sit to stand: 9.18 sec Timed up and go (TUG): 6.22 sec  02/22/22: 5 times sit to stand: 8.68 sec Timed up and go (TUG): 5.58 sec  GAIT: Distance walked: 30 Assistive device utilized: None Level of assistance:  Complete Independence Comments: slow antalgic   TODAY'S TREATMENT:     DATE: 03/01/22 (patient requested short visit - had to leave 20 min prior to end of session) Nustep x 5 min level 5 PPT x 20 PPT with 90/90 heel tap  PPT with dying bug x 20 PPT with [place] twist with yellow plyo ball x 20 PPT with shoulder extension to SLR with yellow plyo ball x 20 PPT with ball pass 2 x 10 with red physio ball Hooklying clam with PPT x 20 with red loop Side lying clam x 20 each with red loop Quadruped fire hydrant x 20 bilaterally Quadruped bird dog x 20 each side  DATE: 02/24/22 Nustep x 5 min level 5 Supine IT band and piriformis stretch 3 x 20 sec each PPT x 20 Hooklying clam with PPT x 20 with yellow loop Side lying clam x 20 each with yellow loop Quadruped fire hydrant x 20 bilaterally Quadruped bird dog x 20 each side Prone super man 2 x 10 PPT with dying bug x 20  Hooklying trunk rotation x 20 Hollow hold pass unders with 4 lb weight Seated in modified hollow hold, [place] twist with increase to blue plyo ball 2 x 10 Star crunch x 20 each side (verbal cues to engage obliques by raising upper body slightly higher) PPT with ball pass 2 x 10 with red physio ball Ice to lumbar spine x 10 min at end of treatment in hook lying  DATE: 02/22/22 Nustep x 5 min level 5 Re-assessment Supine hamstring, IT band and piriformis stretch 2 x 20 sec each PPT x 20 Hooklying clam with PPT x 20 with yellow loop PPT with dying bug x 20  PPT with 90/90 heel tap x 20 Hooklying trunk rotation x 20 PPT with shoulder flexion to SLR with increase to blue plyo ball x 20 each side Seated in modified hollow hold, [place] twist with increase to blue plyo ball Star crunch x 20 each side (verbal cues to engage obliques by raising upper body slightly higher) PPT with ball pass 2 x 10 with red physio ball Ice to lumbar spine x 10 min at end of treatment in hook lying   PATIENT EDUCATION:  Education  details: Initiated HEP Person educated: Patient Education method: Consulting civil engineer, Media planner, Corporate treasurer cues, Verbal cues, and Handouts Education comprehension: verbalized understanding, returned demonstration, and verbal cues required  HOME EXERCISE PROGRAM: Access Code: [email or web address] URL: [email or web address] Date: 02/01/2022 Prepared by: Ruben Im  Exercises - Supine Hamstring Stretch with Strap  - 1 x daily - 7 x weekly - 1 sets - 3 reps - 30 sec hold - Standing Hamstring Stretch on Chair  - 1 x daily - 7 x weekly - 1 sets - 3 reps - 30 sec hold - Standing Quad Stretch with Table and Chair Support  - 1 x daily - 7 x weekly - 1 sets - 3 reps - 30 sec hold - Supine ITB Stretch  with Strap  - 1 x daily - 7 x weekly - 1 sets - 3 reps - 30 sec hold - Supine Figure 4 Piriformis Stretch  - 1 x daily - 7 x weekly - 1 sets - 3 reps - 30 sec hold - Supine Piriformis Stretch with Foot on Ground  - 1 x daily - 7 x weekly - 3 sets - 10 reps - Supine Posterior Pelvic Tilt  - 1 x daily - 7 x weekly - 1 sets - 20 reps - Supine 90/90 Alternating Heel Touches with Posterior Pelvic Tilt  - 1 x daily - 7 x weekly - 1 sets - 20 reps - Supine Dead Bug with Leg Extension  - 1 x daily - 7 x weekly - 1 sets - 20 reps - Bird Dog  - 1 x daily - 7 x weekly - 1 sets - 10 reps - Sidelying Hip Abduction  - 1 x daily - 7 x weekly - 1 sets - 10 reps - Standing Bilateral Low Shoulder Row with Anchored Resistance  - 1 x daily - 7 x weekly - 3 sets - 10 reps - Shoulder extension with resistance - Neutral  - 1 x daily - 7 x weekly - 3 sets - 10 reps ASSESSMENT:  CLINICAL IMPRESSION: Kiaraliz is meeting goals with ease.  She tolerates high level core work without pain and with ease.  She has joined a gym and will begin doing some pilates classes there along with some cardio and general strengthening.  She has 3 visits left for formal PT.  She should transition well to an independent program.  She would benefit  from continued skilled PT to address core strength and flexibility.     OBJECTIVE IMPAIRMENTS: difficulty walking, decreased strength, increased fascial restrictions, increased muscle spasms, impaired flexibility, and pain.   ACTIVITY LIMITATIONS: carrying, lifting, bending, sitting, standing, squatting, sleeping, stairs, transfers, bed mobility, and dressing  PARTICIPATION LIMITATIONS: meal prep, cleaning, laundry, interpersonal relationship, driving, shopping, community activity, occupation, and yard work  PERSONAL FACTORS: Behavior pattern, Education, Fitness, and Past/current experiences are also affecting patient's functional outcome.   REHAB POTENTIAL: Good  CLINICAL DECISION MAKING: Stable/uncomplicated  EVALUATION COMPLEXITY: Low   GOALS: Goals reviewed with patient? Yes  SHORT TERM GOALS: Target date: 02/24/2022   Pain report to be no greater than 4/10  Baseline: Goal status: MET  2.  Patient will be independent with initial HEP  Baseline:  Goal status: INITIAL   LONG TERM GOALS: Target date: 03/24/2022    Patient to report pain no greater than 2/10  Baseline:  Goal status: INITIAL  2.  Patient to be independent with advanced HEP  Baseline:  Goal status: INITIAL  3.  Patient to report 85% improvement in overall symptoms  Baseline:  Goal status: INITIAL  4.  LEFS score to improve by 10 points Baseline:  Goal status: INITIAL  5.  Patient to begin regular exercise routine Baseline:  Goal status: INITIAL   PLAN:  PT FREQUENCY: 1-2x/week  PT DURATION: 8 weeks  PLANNED INTERVENTIONS: Therapeutic exercises, Therapeutic activity, Neuromuscular re-education, Balance training, Gait training, Patient/Family education, Self Care, Joint mobilization, Stair training, DME instructions, Aquatic Therapy, Dry Needling, Electrical stimulation, Spinal mobilization, Cryotherapy, Moist heat, Taping, Vasopneumatic device, Traction, Manual therapy, and  Re-evaluation  PLAN FOR NEXT SESSION: Progress core strength and bilateral LE strength;  DN if patient consents and this is indicated based on symptoms.     Anderson Malta B. Fernandez Kenley, PT 03/01/22  4:53 PM  [facility] [address], [address] [place], [place] [address] Phone # [phone] Fax [phone]

## 2022-03-03 ENCOUNTER — Ambulatory Visit: Payer: Medicaid Other | Attending: Physical Medicine and Rehabilitation | Admitting: Physical Therapy

## 2022-03-03 ENCOUNTER — Telehealth: Payer: Self-pay | Admitting: Physical Therapy

## 2022-03-03 DIAGNOSIS — R252 Cramp and spasm: Secondary | ICD-10-CM | POA: Insufficient documentation

## 2022-03-03 DIAGNOSIS — R262 Difficulty in walking, not elsewhere classified: Secondary | ICD-10-CM | POA: Insufficient documentation

## 2022-03-03 DIAGNOSIS — M7918 Myalgia, other site: Secondary | ICD-10-CM | POA: Insufficient documentation

## 2022-03-03 DIAGNOSIS — M6281 Muscle weakness (generalized): Secondary | ICD-10-CM | POA: Insufficient documentation

## 2022-03-03 NOTE — Telephone Encounter (Signed)
Left message on voicemail regarding missed appt

## 2022-03-08 ENCOUNTER — Ambulatory Visit: Payer: Medicaid Other

## 2022-03-08 DIAGNOSIS — R252 Cramp and spasm: Secondary | ICD-10-CM | POA: Diagnosis present

## 2022-03-08 DIAGNOSIS — M6281 Muscle weakness (generalized): Secondary | ICD-10-CM | POA: Diagnosis present

## 2022-03-08 DIAGNOSIS — M7918 Myalgia, other site: Secondary | ICD-10-CM | POA: Diagnosis present

## 2022-03-08 DIAGNOSIS — R262 Difficulty in walking, not elsewhere classified: Secondary | ICD-10-CM | POA: Diagnosis present

## 2022-03-08 NOTE — Therapy (Signed)
OUTPATIENT PHYSICAL THERAPY LOWER EXTREMITY TREATMENT NOTE     Patient Name: Connie Medina MRN: [id number] DOB:Nov 28, 1990, 32 y.o., female Today's Date: 03/08/2022  END OF SESSION:  PT End of Session - 03/08/22 0900     Visit Number 10    Number of Visits 12    Date for PT Re-Evaluation 04/22/22    Authorization Type [place] MEDICAID HEALTHY BLUE    Authorization Time Period [employer] approved 6 more visits 02/22/2022 - 04/22/2022 [id number]    Authorization - Visit Number 10    Authorization - Number of Visits 12    Progress Note Due on Visit 12    PT Start Time 0857    PT Stop Time 0935    PT Time Calculation (min) 38 min    Activity Tolerance Patient tolerated treatment well    Behavior During Therapy [facility] for tasks assessed/performed               Past Medical History:  Diagnosis Date   Infection    UTI   Migraines    Past Surgical History:  Procedure Laterality Date   CESAREAN SECTION N/A 02/13/2019   Procedure: CESAREAN SECTION;  Surgeon: Janyth Pupa, DO;  Location: [facility] LD [facility]S;  Service: Obstetrics;  Laterality: N/A;   TONSILLECTOMY AND ADENOIDECTOMY Bilateral 09/30/2014   Procedure: BILATERAL TONSILLECTOMY AND ADENOIDECTOMY;  Surgeon: Leta Baptist, MD;  Location: [place];  Service: ENT;  Laterality: Bilateral;   Patient Active Problem List   Diagnosis Date Noted   Cesarean delivery delivered 02/16/2019   Postpartum hemorrhage 02/16/2019   Normal labor 02/13/2019    PCP: no PCP per patient  REFERRING PROVIDER: Marlaine Hind, MD  REFERRING DIAG: M79.18 (ICD-10-CM) - Myalgia, other site  THERAPY DIAG:  Myalgia, other site  Muscle weakness (generalized)  Cramp and spasm  Difficulty in walking, not elsewhere classified  Rationale for Evaluation and Treatment: Rehabilitation  ONSET DATE: 01/27/2022  SUBJECTIVE:   SUBJECTIVE STATEMENT: "No pain"  this morning but when I am caring for my kids and doing housework, I still get the same  pain"     PERTINENT HISTORY: none PAIN:  Are you having pain? Yes: NPRS scale: 0/10 Pain location: lumbar spine Pain description: aching  Aggravating factors: sleeping on her belly and with standing and bending too long Relieving factors: Lies on the floor  PRECAUTIONS: None  WEIGHT BEARING RESTRICTIONS: No  FALLS:  Has patient fallen in last 6 months? 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    PATIENT SURVEYS:  Oswestry: 16 02/22/22: 18 LEFS: 50/80= 62.5% 02/22/22: 45  COGNITION: Overall cognitive status: Within functional limits for tasks assessed     SENSATION: WFL   MUSCLE LENGTH: Hamstrings: Right 60 deg; Left 70 deg Thomas test: Right pos ; Left pos   POSTURE: increased lumbar lordosis   LOWER EXTREMITY ROM:  All WFL  LOWER EXTREMITY MMT:  Generally 4 to  4+/5 02/22/22: 4+/5   SLR test:   negative Slump test:  negative  FUNCTIONAL TESTS:  5 times sit to stand: 9.18 sec Timed up and go (TUG): 6.22 sec  02/22/22: 5 times sit to stand: 8.68 sec Timed up and go (TUG): 5.58 sec  GAIT: Distance walked: 30 Assistive device utilized: None Level of assistance: Complete Independence Comments: slow antalgic   TODAY'S TREATMENT:     DATE: 03/08/22 (patient 13 min late) Nustep x 5 min level 7 Green ball roll outs: fwd and lateral x 10 each  direction Bridge with hamstring curl 2 x 10 Plank position on edge of mat table with knees on green ball, reverse abdominal pulls 2 x 10 PPT x 20 PPT with 90/90 heel tap  PPT with dying bug x 20 PPT with [place] twist with yellow plyo ball x 20 PPT with shoulder extension to SLR with yellow plyo ball x 20 PPT with ball pass 2 x 10 with red physio ball Hooklying clam with PPT x 20 with red loop Side lying clam x 20 each with red loop Superman x 20 with pillow under abdomen Ice to lumbar spine in hook lying x 10 min  DATE: 03/01/22 (patient requested short visit - had to leave 20 min prior to end of session) Nustep x 5 min level 5 PPT x 20 PPT with 90/90 heel tap  PPT with dying bug x 20 PPT with [place] twist with yellow plyo ball x 20 PPT with shoulder extension to SLR with yellow plyo ball x 20 PPT with ball pass 2 x 10 with red physio ball Hooklying clam with PPT x 20 with red loop Side lying clam x 20 each with red loop Quadruped fire hydrant x 20 bilaterally Quadruped bird dog x 20 each side  DATE: 02/24/22 Nustep x 5 min level 5 Supine IT band and piriformis stretch 3 x 20 sec each PPT x 20 Hooklying clam with PPT x 20 with yellow loop Side lying clam x 20 each with yellow loop Quadruped fire hydrant x 20 bilaterally Quadruped bird dog x 20 each side Prone super man 2 x 10 PPT with dying bug x 20  Hooklying trunk rotation x 20 Hollow hold pass unders with 4 lb weight Seated in modified hollow hold, [place] twist with increase to blue plyo ball 2 x 10 Star crunch x 20 each side (verbal cues to engage obliques by raising upper body slightly higher) PPT with ball pass 2 x 10 with red physio ball Ice to lumbar spine x 10 min at end of treatment in hook lying   PATIENT EDUCATION:  Education details: Initiated HEP Person educated: Patient Education method: Consulting civil engineer, Media planner, Corporate treasurer cues, Verbal cues, and Handouts Education comprehension: verbalized  understanding, returned demonstration, and verbal cues required  HOME EXERCISE PROGRAM: Access Code: [email or web address] URL: [email or web address] Date: 02/01/2022 Prepared by: Ruben Im  Exercises - Supine Hamstring Stretch with Strap  - 1 x daily - 7 x weekly - 1 sets - 3 reps - 30 sec hold - Standing Hamstring Stretch on Chair  - 1 x daily - 7 x weekly - 1 sets - 3 reps - 30 sec hold - Standing Quad Stretch with Table and Chair Support  - 1 x daily - 7 x weekly - 1 sets - 3 reps - 30 sec hold - Supine ITB Stretch with Strap  - 1 x daily - 7 x weekly - 1 sets - 3 reps - 30 sec hold -  Supine Figure 4 Piriformis Stretch  - 1 x daily - 7 x weekly - 1 sets - 3 reps - 30 sec hold - Supine Piriformis Stretch with Foot on Ground  - 1 x daily - 7 x weekly - 3 sets - 10 reps - Supine Posterior Pelvic Tilt  - 1 x daily - 7 x weekly - 1 sets - 20 reps - Supine 90/90 Alternating Heel Touches with Posterior Pelvic Tilt  - 1 x daily - 7 x weekly - 1 sets - 20 reps - Supine Dead Bug with Leg Extension  - 1 x daily - 7 x weekly - 1 sets - 20 reps - Bird Dog  - 1 x daily - 7 x weekly - 1 sets - 10 reps - Sidelying Hip Abduction  - 1 x daily - 7 x weekly - 1 sets - 10 reps - Standing Bilateral Low Shoulder Row with Anchored Resistance  - 1 x daily - 7 x weekly - 3 sets - 10 reps - Shoulder extension with resistance - Neutral  - 1 x daily - 7 x weekly - 3 sets - 10 reps ASSESSMENT:  CLINICAL IMPRESSION: Connie Medina is progressing appropriately.  She was able to tolerate addition of progressively higher level core work today without issue.   She has 2 visits left for formal PT.  She should transition well to an independent program.  She would benefit from continued skilled PT to address core strength and flexibility.     OBJECTIVE IMPAIRMENTS: difficulty walking, decreased strength, increased fascial restrictions, increased muscle spasms, impaired flexibility, and pain.   ACTIVITY LIMITATIONS:  carrying, lifting, bending, sitting, standing, squatting, sleeping, stairs, transfers, bed mobility, and dressing  PARTICIPATION LIMITATIONS: meal prep, cleaning, laundry, interpersonal relationship, driving, shopping, community activity, occupation, and yard work  PERSONAL FACTORS: Behavior pattern, Education, Fitness, and Past/current experiences are also affecting patient's functional outcome.   REHAB POTENTIAL: Good  CLINICAL DECISION MAKING: Stable/uncomplicated  EVALUATION COMPLEXITY: Low   GOALS: Goals reviewed with patient? Yes  SHORT TERM GOALS: Target date: 02/24/2022   Pain report to be no greater than 4/10  Baseline: Goal status: MET  2.  Patient will be independent with initial HEP  Baseline:  Goal status: MET   LONG TERM GOALS: Target date: 03/24/2022    Patient to report pain no greater than 2/10  Baseline:  Goal status: MET  2.  Patient to be independent with advanced HEP  Baseline:  Goal status: MET  3.  Patient to report 85% improvement in overall symptoms  Baseline:  Goal status: IN PROGRESS  4.  LEFS score to improve by 10 points Baseline:  Goal status: IN PROGRESS  5.  Patient to begin regular exercise routine Baseline:  Goal status: MET   PLAN:  PT FREQUENCY: 1-2x/week  PT DURATION: 8 weeks  PLANNED INTERVENTIONS: Therapeutic exercises, Therapeutic activity, Neuromuscular re-education, Balance training, Gait training, Patient/Family education, Self Care, Joint mobilization, Stair training, DME instructions, Aquatic Therapy, Dry Needling, Electrical stimulation, Spinal mobilization, Cryotherapy, Moist heat, Taping, Vasopneumatic device, Traction, Manual therapy, and Re-evaluation  PLAN FOR NEXT SESSION: Progress core strength and bilateral LE strength;  DN if patient consents and this is indicated based on symptoms.     Anderson Malta B. Desaree Downen, PT 03/08/22 10:26 AM  [facility] [address] [place], [place] [address] Phone # [phone] Fax [phone]

## 2022-03-15 ENCOUNTER — Ambulatory Visit: Payer: Medicaid Other

## 2022-03-15 DIAGNOSIS — M7918 Myalgia, other site: Secondary | ICD-10-CM | POA: Diagnosis not present

## 2022-03-15 DIAGNOSIS — R252 Cramp and spasm: Secondary | ICD-10-CM

## 2022-03-15 DIAGNOSIS — R262 Difficulty in walking, not elsewhere classified: Secondary | ICD-10-CM

## 2022-03-15 NOTE — Therapy (Addendum)
 OUTPATIENT PHYSICAL THERAPY LOWER EXTREMITY TREATMENT NOTE PHYSICAL THERAPY DISCHARGE SUMMARY  Visits from Start of Care: 11  Current functional level related to goals / functional outcomes: See below   Remaining deficits: See below   Education / Equipment: See below   Patient agrees to discharge. Patient goals were partially met. Patient is being discharged due to not returning since the last visit.      Patient Name: Connie Medina MRN: [id number] DOB:[birth year], 32 y.o., female Today's Date: 03/15/2022  END OF SESSION:  PT End of Session - 03/15/22 0851     Visit Number 11    Number of Visits 12    Date for PT Re-Evaluation 04/22/22    Authorization Type [place] MEDICAID HEALTHY BLUE    Authorization Time Period [employer] approved 6 more visits 02/22/2022 - 04/22/2022 [id number]    Authorization - Visit Number 11    Authorization - Number of Visits 12    Progress Note Due on Visit 12    PT Start Time 0848    PT Stop Time 0912    PT Time Calculation (min) 24 min    Activity Tolerance Patient tolerated treatment well    Behavior During Therapy [facility] for tasks assessed/performed               Past Medical History:  Diagnosis Date   Infection    UTI   Migraines    Past Surgical History:  Procedure Laterality Date   CESAREAN SECTION N/A 02/13/2019   Procedure: CESAREAN SECTION;  Surgeon: Myna Hidalgo, DO;  Location: [facility];  Service: Obstetrics;  Laterality: N/A;   TONSILLECTOMY AND ADENOIDECTOMY Bilateral 09/30/2014   Procedure: BILATERAL TONSILLECTOMY AND ADENOIDECTOMY;  Surgeon: Newman Pies, MD;  Location: [facility];  Service: ENT;  Laterality: Bilateral;   Patient Active Problem List   Diagnosis Date Noted   Cesarean delivery delivered 02/16/2019   Postpartum hemorrhage 02/16/2019   Normal labor 02/13/2019    PCP: no PCP per patient  REFERRING PROVIDER: Callie Fielding, MD  REFERRING DIAG: M79.18 (ICD-10-CM) - Myalgia, other  site  THERAPY DIAG:  Myalgia, other site  Cramp and spasm  Difficulty in walking, not elsewhere classified  Rationale for Evaluation and Treatment: Rehabilitation  ONSET DATE: 01/27/2022  SUBJECTIVE:   SUBJECTIVE STATEMENT: "No pain"  this morning but still same pain at home.     PERTINENT HISTORY: none PAIN:  Are you having pain? Yes: NPRS scale: 0/10 Pain location: lumbar spine Pain description: aching  Aggravating factors: sleeping on her belly and with standing and bending too long Relieving factors: Lies on the floor  PRECAUTIONS: None  WEIGHT BEARING RESTRICTIONS: No  FALLS:  Has patient fallen in last 6 months? 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    PATIENT SURVEYS:  Oswestry: 16 02/22/22: 18 LEFS: 50/80= 62.5% 02/22/22: 45  COGNITION: Overall cognitive status: Within functional limits for tasks assessed  SENSATION: WFL   MUSCLE LENGTH: Hamstrings: Right 60 deg; Left 70 deg Thomas test: Right pos ; Left pos   POSTURE: increased lumbar lordosis   LOWER EXTREMITY ROM:  All WFL  LOWER EXTREMITY MMT:  Generally 4 to 4+/5 02/22/22: 4+/5   SLR test:   negative Slump test:  negative  FUNCTIONAL TESTS:  5 times sit to stand: 9.18 sec Timed up and go (TUG): 6.22 sec  02/22/22: 5 times sit to stand: 8.68 sec Timed up and go  (TUG): 5.58 sec  GAIT: Distance walked: 30 Assistive device utilized: None Level of assistance: Complete Independence Comments: slow antalgic   TODAY'S TREATMENT:     DATE: 03/15/22 (patient states she needs to leave early- no later than 9:10/9:15) Recumbent bike x 5 min level 1 (PT present to discuss status PPT x 20 PPT with 90/90 heel tap  PPT with dying bug x 20 PPT with Guernsey twist with yellow plyo ball x 20 PPT with ball pass 2 x 10 with red physio ball PPT with V-up (patient unable to do full V-up but had no pain) PPT with Mummy sit up with yellow plyo ball x 10 Educated on ways to progress the above exercises to a more challenging level by adding weights and lowering legs to a lower level once she is able to maintain pelvic tilt.    DATE: 03/08/22 (patient 13 min late) Nustep x 5 min level 7 Green ball roll outs: fwd and lateral x 10 each direction Bridge with hamstring curl 2 x 10 Plank position on edge of mat table with knees on green ball, reverse abdominal pulls 2 x 10 PPT x 20 PPT with 90/90 heel tap  PPT with dying bug x 20 PPT with Guernsey twist with yellow plyo ball x 20 PPT with shoulder extension to SLR with yellow plyo ball x 20 PPT with ball pass 2 x 10 with red physio ball Hooklying clam with PPT x 20 with red loop Side lying clam x 20 each with red loop Superman x 20 with pillow under abdomen Ice to lumbar spine in hook lying x 10 min  DATE: 03/01/22 (patient requested short visit - had to leave 20 min prior to end of session) Nustep x 5 min level 5 PPT x 20 PPT with 90/90 heel tap  PPT with dying bug x 20 PPT with Guernsey twist with yellow plyo ball x 20 PPT with shoulder extension to SLR with yellow plyo ball x 20 PPT with ball pass 2 x 10 with red physio ball Hooklying clam with PPT x 20 with red loop Side lying clam x 20 each with red loop Quadruped fire hydrant x 20 bilaterally Quadruped bird dog x 20 each side   PATIENT EDUCATION:   Education details: Initiated HEP Person educated: Patient Education method: Programmer, multimedia, Facilities manager, Actor cues, Verbal cues, and Handouts Education comprehension: verbalized understanding, returned demonstration, and verbal cues required  HOME EXERCISE PROGRAM: Access Code: [email or web address] URL: [email or web address] Date: 02/01/2022 Prepared by: Lavinia Sharps  Exercises - Supine Hamstring Stretch with Strap  - 1 x daily - 7 x weekly - 1 sets - 3 reps - 30 sec hold - Standing Hamstring Stretch on Chair  - 1 x daily - 7 x weekly - 1 sets - 3 reps - 30 sec hold - Standing Quad Stretch with Table and Chair Support  - 1 x daily - 7 x weekly - 1 sets - 3 reps - 30 sec hold - Supine ITB  Stretch with Strap  - 1 x daily - 7 x weekly - 1 sets - 3 reps - 30 sec hold - Supine Figure 4 Piriformis Stretch  - 1 x daily - 7 x weekly - 1 sets - 3 reps - 30 sec hold - Supine Piriformis Stretch with Foot on Ground  - 1 x daily - 7 x weekly - 3 sets - 10 reps - Supine Posterior Pelvic Tilt  - 1 x daily - 7 x weekly - 1 sets - 20 reps - Supine 90/90 Alternating Heel Touches with Posterior Pelvic Tilt  - 1 x daily - 7 x weekly - 1 sets - 20 reps - Supine Dead Bug with Leg Extension  - 1 x daily - 7 x weekly - 1 sets - 20 reps - Bird Dog  - 1 x daily - 7 x weekly - 1 sets - 10 reps - Sidelying Hip Abduction  - 1 x daily - 7 x weekly - 1 sets - 10 reps - Standing Bilateral Low Shoulder Row with Anchored Resistance  - 1 x daily - 7 x weekly - 3 sets - 10 reps - Shoulder extension with resistance - Neutral  - 1 x daily - 7 x weekly - 3 sets - 10 reps ASSESSMENT:  CLINICAL IMPRESSION: Marise tolerates extremely high level core work even though she is not yet strong enough to do full star crunch or V-up, she does not have pain with attempting this.  She has 1 visit left for formal PT.  She should transition well to an independent program.  She would benefit from her last visit of skilled PT to address  core strength and flexibility, to educate on progressing HEP and for updating HEP.     OBJECTIVE IMPAIRMENTS: difficulty walking, decreased strength, increased fascial restrictions, increased muscle spasms, impaired flexibility, and pain.   ACTIVITY LIMITATIONS: carrying, lifting, bending, sitting, standing, squatting, sleeping, stairs, transfers, bed mobility, and dressing  PARTICIPATION LIMITATIONS: meal prep, cleaning, laundry, interpersonal relationship, driving, shopping, community activity, occupation, and yard work  PERSONAL FACTORS: Behavior pattern, Education, Fitness, and Past/current experiences are also affecting patient's functional outcome.   REHAB POTENTIAL: Good  CLINICAL DECISION MAKING: Stable/uncomplicated  EVALUATION COMPLEXITY: Low   GOALS: Goals reviewed with patient? Yes  SHORT TERM GOALS: Target date: 02/24/2022   Pain report to be no greater than 4/10  Baseline: Goal status: MET  2.  Patient will be independent with initial HEP  Baseline:  Goal status: MET   LONG TERM GOALS: Target date: 03/24/2022    Patient to report pain no greater than 2/10  Baseline:  Goal status: MET  2.  Patient to be independent with advanced HEP  Baseline:  Goal status: MET  3.  Patient to report 85% improvement in overall symptoms  Baseline:  Goal status: IN PROGRESS  4.  LEFS score to improve by 10 points Baseline:  Goal status: IN PROGRESS  5.  Patient to begin regular exercise routine Baseline:  Goal status: MET   PLAN:  PT FREQUENCY: 1-2x/week  PT DURATION: 8 weeks  PLANNED INTERVENTIONS: Therapeutic exercises, Therapeutic activity, Neuromuscular re-education, Balance training, Gait training, Patient/Family education, Self Care, Joint mobilization, Stair training, DME instructions, Aquatic Therapy, Dry Needling, Electrical stimulation, Spinal mobilization, Cryotherapy, Moist heat, Taping, Vasopneumatic device, Traction, Manual therapy, and  Re-evaluation  PLAN FOR NEXT SESSION: Update and review HEP.  Plan on DC next visit.    Victorino Dike B. Matilda Fleig, PT 03/15/22 9:22 AM  [facility] [address] [place], [place] [address] Phone # [phone] Fax [phone]   DC 04/11/23 Lilyan Gilford. Lasundra Hascall, PT 04/11/23 10:11 AM

## 2022-03-23 ENCOUNTER — Emergency Department (HOSPITAL_COMMUNITY)
Admission: EM | Admit: 2022-03-23 | Discharge: 2022-03-24 | Disposition: A | Discharge status: Home / Self Care | Payer: Medicaid Other | Attending: Emergency Medicine | Admitting: Emergency Medicine

## 2022-03-23 ENCOUNTER — Emergency Department (HOSPITAL_COMMUNITY): Payer: Medicaid Other

## 2022-03-23 ENCOUNTER — Other Ambulatory Visit: Payer: Self-pay

## 2022-03-23 DIAGNOSIS — R0789 Other chest pain: Secondary | ICD-10-CM | POA: Diagnosis present

## 2022-03-23 DIAGNOSIS — R062 Wheezing: Secondary | ICD-10-CM | POA: Diagnosis not present

## 2022-03-23 LAB — CBC
HCT: 38.1 % (ref 36.0–46.0)
Hemoglobin: 12 g/dL (ref 12.0–15.0)
MCH: 26.5 pg (ref 26.0–34.0)
MCHC: 31.5 g/dL (ref 30.0–36.0)
MCV: 84.3 fL (ref 80.0–100.0)
Platelets: 273 10*3/uL (ref 150–400)
RBC: 4.52 MIL/uL (ref 3.87–5.11)
RDW: 13.1 % (ref 11.5–15.5)
WBC: 6 10*3/uL (ref 4.0–10.5)
nRBC: 0 % (ref 0.0–0.2)

## 2022-03-23 LAB — BASIC METABOLIC PANEL
Anion gap: 9 (ref 5–15)
BUN: 15 mg/dL (ref 6–20)
CO2: 24 mmol/L (ref 22–32)
Calcium: 9.7 mg/dL (ref 8.9–10.3)
Chloride: 103 mmol/L (ref 98–111)
Creatinine, Ser: 0.68 mg/dL (ref 0.44–1.00)
GFR, Estimated: >60 mL/min (ref >60)
Glucose, Bld: 91 mg/dL (ref 70–99)
Potassium: 3.7 mmol/L (ref 3.5–5.1)
Sodium: 136 mmol/L (ref 135–145)

## 2022-03-23 LAB — TROPONIN I (HIGH SENSITIVITY)
Troponin I (High Sensitivity): 3 ng/L (ref <18)
Troponin I (High Sensitivity): 5 ng/L (ref <18)

## 2022-03-23 NOTE — ED Provider Notes (Signed)
[facility] Provider Note   CSN: [id number] Arrival date & time: 03/23/22  1942     History {Add pertinent medical, surgical, social history, OB history to HPI:1} Chief Complaint  Patient presents with   Shortness of Breath    Connie Medina is a 32 y.o. female.  32 year old female with no significant past medical history presents with complaint of chest tightness. Patient states she was at the gym tonight, completed her usual 30 min walk on the tread mill and noticed chest tightness and wheezing. Patient went into the sauna and her symptoms became worse. Patient left the sauna and went to her car where she rested and drank water. Symptoms were gradually improving but not resolving which prompted her to present to the ER for evaluation.  States her symptoms lasted for about 30 to 45 minutes, onset around 7 PM.  Resolved and have not returned.  No history of asthma or chronic lung disease.  Does not smoke or vape.  No history of similar symptoms previously.  States that she had COVID or other similar viral illness about 1 month ago, symptoms resolved, has not had problems since.  Denies recent extended travel, OCP use, history of PE or DVT, leg swelling.  No other complaints or concerns today.       Home Medications Prior to Admission medications   Medication Sig Start Date End Date Taking? Authorizing Provider  acetaminophen (TYLENOL) 500 MG tablet Take 500-1,000 mg by mouth every 6 (six) hours as needed for mild pain.    [provider]  ibuprofen (ADVIL) 600 MG tablet Take 1 tablet (600 mg total) by mouth every 6 (six) hours. 02/16/19   Crumpler, Marveen Reeks, CNM  oxyCODONE (OXY IR/ROXICODONE) 5 MG immediate release tablet Take 1 tablet (5 mg total) by mouth every 6 (six) hours as needed for moderate pain. 02/16/19   Crumpler, Marveen Reeks, CNM  Prenatal Vit-Fe Fumarate-FA (PRENATAL MULTIVITAMIN) TABS tablet Take 1 tablet by mouth at  bedtime.    [provider]      Allergies    Patient has no known allergies.    Review of Systems   Review of Systems Negative except as per HPI Physical Exam Updated Vital Signs BP 137/84   Pulse 89   Temp 98 F (36.7 C) (Oral)   Resp 18   LMP 03/18/2022   SpO2 100%  Physical Exam Vitals and nursing note reviewed.  Constitutional:      General: She is not in acute distress.    Appearance: She is well-developed. She is not diaphoretic.  HENT:     Head: Normocephalic and atraumatic.  Cardiovascular:     Rate and Rhythm: Normal rate and regular rhythm.  Pulmonary:     Effort: Pulmonary effort is normal.     Breath sounds: Normal breath sounds. No decreased breath sounds or wheezing.  Chest:     Chest wall: No tenderness.  Musculoskeletal:     Cervical back: Neck supple.     Right lower leg: No tenderness. No edema.     Left lower leg: No tenderness. No edema.  Skin:    General: Skin is warm and dry.     Findings: No erythema.     Nails: There is no clubbing.  Neurological:     Mental Status: She is alert and oriented to person, place, and time.  Psychiatric:        Behavior: Behavior normal.  ED Results / Procedures / Treatments   Labs (all labs ordered are listed, but only abnormal results are displayed) Labs Reviewed  BASIC METABOLIC PANEL  CBC  TROPONIN I (HIGH SENSITIVITY)  TROPONIN I (HIGH SENSITIVITY)    EKG None  Radiology DG Chest 2 View  Result Date: 03/23/2022 CLINICAL DATA:  chest pain sob EXAM: CHEST - 2 VIEW COMPARISON:  None Available. FINDINGS: The heart and mediastinal contours are within normal limits. No focal consolidation. No pulmonary edema. No pleural effusion. No pneumothorax. No acute osseous abnormality. IMPRESSION: No active cardiopulmonary disease. Electronically Signed   By: Iven Finn M.D.   On: 03/23/2022 20:20    Procedures Procedures  {Document cardiac monitor, telemetry assessment procedure when  appropriate:1}  Medications Ordered in ED Medications - No data to display  ED Course/ Medical Decision Making/ A&P   {   Click here for ABCD2, HEART and other calculatorsREFRESH Note before signing :1}                          Medical Decision Making  This patient presents to the ED for concern of ***, this involves an extensive number of treatment options, and is a complaint that carries with it a high risk of complications and morbidity.  The differential diagnosis includes ***   Co morbidities that complicate the patient evaluation  ***   Additional history obtained:  Additional history obtained from *** External records from outside source obtained and reviewed including ***   Lab Tests:  I Ordered, and personally interpreted labs.  The pertinent results include:  ***   Imaging Studies ordered:  I ordered imaging studies including ***  I independently visualized and interpreted imaging which showed *** I agree with the radiologist interpretation   Cardiac Monitoring: / EKG:  The patient was maintained on a cardiac monitor.  I personally viewed and interpreted the cardiac monitored which showed an underlying rhythm of: ***   Consultations Obtained:  I requested consultation with the ***,  and discussed lab and imaging findings as well as pertinent plan - they recommend: ***   Problem List / ED Course / Critical interventions / Medication management  *** I ordered medication including ***  for ***  Reevaluation of the patient after these medicines showed that the patient {resolved/improved/worsened:23923::"improved"} I have reviewed the patients home medicines and have made adjustments as needed   Social Determinants of Health:  ***   Test / Admission - Considered:  ***   {Document critical care time when appropriate:1} {Document review of labs and clinical decision tools ie heart score, Chads2Vasc2 etc:1}  {Document your independent review of  radiology images, and any outside records:1} {Document your discussion with family members, caretakers, and with consultants:1} {Document social determinants of health affecting pt's care:1} {Document your decision making why or why not admission, treatments were needed:1} Final Clinical Impression(s) / ED Diagnoses Final diagnoses:  None    Rx / DC Orders ED Discharge Orders     None

## 2022-03-23 NOTE — ED Triage Notes (Signed)
Pt c/o SOB and CP while walking on the treadmill. Reports cough and wheezing. Denies hx asthma.

## 2022-03-23 NOTE — ED Provider Triage Note (Signed)
Emergency Medicine Provider Triage Evaluation Note  Connie Medina , a 32 y.o. female  was evaluated in triage.  Pt complains of chest pain that started about an hour ago when she was on a treadmill.  She described it as tightness, intermittent, located in the center of her chest, non-radiating, worse with deep breathing.  No fever nausea vomiting or extremity weakness or numbness.  Review of Systems  Positive: As above Negative: As above  Physical Exam  BP (!) 143/97 (BP Location: Left Arm)   Pulse 95   Temp 98 F (36.7 C) (Oral)   Resp (!) 21   SpO2 98%  Gen:   Awake, no distress   Resp:  Normal effort  MSK:   Moves extremities without difficulty  Other:    Medical Decision Making  Medically screening exam initiated at 8:07 PM.  Appropriate orders placed.  Connie Medina was informed that the remainder of the evaluation will be completed by another provider, this initial triage assessment does not replace that evaluation, and the importance of remaining in the ED until their evaluation is complete.    Rex Kras, [place] 03/24/22 [phone]

## 2022-03-24 ENCOUNTER — Ambulatory Visit: Payer: Medicaid Other

## 2022-03-24 NOTE — Discharge Instructions (Signed)
Follow up with your primary care provider for recheck. Return to the ER at any time for worsening or concerning symptoms.

## 2022-03-30 ENCOUNTER — Ambulatory Visit: Payer: Medicaid Other

## 2022-06-20 ENCOUNTER — Ambulatory Visit: Payer: Medicaid Other | Admitting: Family Medicine

## 2022-09-28 ENCOUNTER — Emergency Department (HOSPITAL_BASED_OUTPATIENT_CLINIC_OR_DEPARTMENT_OTHER)
Admission: EM | Admit: 2022-09-28 | Discharge: 2022-09-28 | Disposition: A | Discharge status: Home / Self Care | Payer: BLUE CROSS/BLUE SHIELD | Attending: Emergency Medicine | Admitting: Emergency Medicine

## 2022-09-28 ENCOUNTER — Other Ambulatory Visit: Payer: Self-pay

## 2022-09-28 ENCOUNTER — Encounter (HOSPITAL_BASED_OUTPATIENT_CLINIC_OR_DEPARTMENT_OTHER): Payer: Self-pay | Admitting: Emergency Medicine

## 2022-09-28 DIAGNOSIS — Y92410 Unspecified street and highway as the place of occurrence of the external cause: Secondary | ICD-10-CM | POA: Diagnosis not present

## 2022-09-28 DIAGNOSIS — M545 Low back pain, unspecified: Secondary | ICD-10-CM | POA: Insufficient documentation

## 2022-09-28 DIAGNOSIS — M25512 Pain in left shoulder: Secondary | ICD-10-CM | POA: Diagnosis not present

## 2022-09-28 DIAGNOSIS — M542 Cervicalgia: Secondary | ICD-10-CM | POA: Diagnosis present

## 2022-09-28 MED ORDER — CYCLOBENZAPRINE HCL 10 MG PO TABS: 10.0000 mg | ORAL_TABLET | Freq: Two times a day (BID) | ORAL | 0 refills | Status: AC | PRN
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
## 2022-09-28 NOTE — ED Notes (Signed)
Patient verbalizes understanding of discharge instructions. Opportunity for questioning and answers were provided. Armband removed by staff, pt discharged from ED. Ambulated out to lobby  

## 2022-09-28 NOTE — Discharge Instructions (Addendum)
Please take your medications as prescribed. Take tylenol/ibuprofen for pain.  You can also use heat pads or cold compresses for symptom relief.  I recommend close follow-up with PCP and ortho for reevaluation.  Please do not hesitate to return to emergency department if worrisome signs symptoms we discussed become apparent.
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
## 2023-12-21 ENCOUNTER — Ambulatory Visit: Attending: Physician Assistant | Admitting: Physical Therapy

## 2023-12-21 ENCOUNTER — Encounter: Payer: Self-pay | Admitting: Physical Therapy

## 2023-12-21 ENCOUNTER — Other Ambulatory Visit: Payer: Self-pay

## 2023-12-21 DIAGNOSIS — M6281 Muscle weakness (generalized): Secondary | ICD-10-CM | POA: Diagnosis present

## 2023-12-21 DIAGNOSIS — M5459 Other low back pain: Secondary | ICD-10-CM | POA: Insufficient documentation

## 2023-12-21 DIAGNOSIS — M25552 Pain in left hip: Secondary | ICD-10-CM | POA: Diagnosis present
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
## 2024-02-05 ENCOUNTER — Encounter: Payer: Self-pay | Admitting: Physical Therapy

## 2024-02-05 ENCOUNTER — Ambulatory Visit: Attending: Physician Assistant | Admitting: Physical Therapy

## 2024-02-05 DIAGNOSIS — M6281 Muscle weakness (generalized): Secondary | ICD-10-CM | POA: Insufficient documentation

## 2024-02-05 DIAGNOSIS — R262 Difficulty in walking, not elsewhere classified: Secondary | ICD-10-CM | POA: Diagnosis present

## 2024-02-05 DIAGNOSIS — M7918 Myalgia, other site: Secondary | ICD-10-CM | POA: Insufficient documentation

## 2024-02-05 DIAGNOSIS — M25552 Pain in left hip: Secondary | ICD-10-CM | POA: Insufficient documentation

## 2024-02-05 DIAGNOSIS — R252 Cramp and spasm: Secondary | ICD-10-CM | POA: Diagnosis present

## 2024-02-05 DIAGNOSIS — M5459 Other low back pain: Secondary | ICD-10-CM | POA: Diagnosis present

## 2024-02-07 ENCOUNTER — Ambulatory Visit: Admitting: Physical Therapy
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
## 2024-02-08 ENCOUNTER — Ambulatory Visit: Admitting: Physical Therapy

## 2024-02-08 ENCOUNTER — Encounter: Payer: Self-pay | Admitting: Physical Therapy

## 2024-02-08 DIAGNOSIS — M6281 Muscle weakness (generalized): Secondary | ICD-10-CM

## 2024-02-08 DIAGNOSIS — M5459 Other low back pain: Secondary | ICD-10-CM

## 2024-02-08 DIAGNOSIS — R262 Difficulty in walking, not elsewhere classified: Secondary | ICD-10-CM

## 2024-02-08 DIAGNOSIS — M25552 Pain in left hip: Secondary | ICD-10-CM

## 2024-02-08 DIAGNOSIS — M7918 Myalgia, other site: Secondary | ICD-10-CM

## 2024-02-08 DIAGNOSIS — R252 Cramp and spasm: Secondary | ICD-10-CM
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
## 2024-02-13 ENCOUNTER — Ambulatory Visit: Admitting: Physical Therapy

## 2024-02-13 ENCOUNTER — Encounter: Payer: Self-pay | Admitting: Physical Therapy

## 2024-02-13 DIAGNOSIS — M6281 Muscle weakness (generalized): Secondary | ICD-10-CM | POA: Diagnosis not present

## 2024-02-13 DIAGNOSIS — R252 Cramp and spasm: Secondary | ICD-10-CM

## 2024-02-13 DIAGNOSIS — M7918 Myalgia, other site: Secondary | ICD-10-CM

## 2024-02-13 DIAGNOSIS — M25552 Pain in left hip: Secondary | ICD-10-CM

## 2024-02-13 DIAGNOSIS — M5459 Other low back pain: Secondary | ICD-10-CM

## 2024-02-13 DIAGNOSIS — R262 Difficulty in walking, not elsewhere classified: Secondary | ICD-10-CM
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
## 2024-02-15 ENCOUNTER — Ambulatory Visit: Admitting: Physical Therapy

## 2024-02-15 ENCOUNTER — Encounter: Payer: Self-pay | Admitting: Physical Therapy

## 2024-02-15 DIAGNOSIS — R262 Difficulty in walking, not elsewhere classified: Secondary | ICD-10-CM

## 2024-02-15 DIAGNOSIS — R252 Cramp and spasm: Secondary | ICD-10-CM

## 2024-02-15 DIAGNOSIS — M6281 Muscle weakness (generalized): Secondary | ICD-10-CM

## 2024-02-15 DIAGNOSIS — M7918 Myalgia, other site: Secondary | ICD-10-CM

## 2024-02-15 DIAGNOSIS — M5459 Other low back pain: Secondary | ICD-10-CM

## 2024-02-15 DIAGNOSIS — M25552 Pain in left hip: Secondary | ICD-10-CM

## 2024-03-12 ENCOUNTER — Ambulatory Visit: Attending: Physician Assistant | Admitting: Physical Therapy

## 2024-03-21 ENCOUNTER — Ambulatory Visit: Admitting: Physical Therapy

## 2024-03-25 ENCOUNTER — Ambulatory Visit: Admitting: Physical Therapy
